# Patient Record
Sex: Female | Born: 1960 | Race: Black or African American | Hispanic: No | Marital: Married | Smoking: Never smoker
Health system: Southern US, Community
[De-identification: ages and names within clinical notes are randomized; demographics above are authoritative.]

## PROBLEM LIST (undated history)

## (undated) DIAGNOSIS — M545 Low back pain, unspecified: Secondary | ICD-10-CM

## (undated) DIAGNOSIS — R143 Flatulence: Secondary | ICD-10-CM

## (undated) DIAGNOSIS — E669 Obesity, unspecified: Secondary | ICD-10-CM

## (undated) DIAGNOSIS — B379 Candidiasis, unspecified: Secondary | ICD-10-CM

## (undated) DIAGNOSIS — R220 Localized swelling, mass and lump, head: Secondary | ICD-10-CM

## (undated) DIAGNOSIS — R7309 Other abnormal glucose: Secondary | ICD-10-CM

## (undated) DIAGNOSIS — I10 Essential (primary) hypertension: Secondary | ICD-10-CM

## (undated) DIAGNOSIS — B351 Tinea unguium: Secondary | ICD-10-CM

## (undated) DIAGNOSIS — M542 Cervicalgia: Secondary | ICD-10-CM

## (undated) HISTORY — DX: Cervicalgia: M54.2

## (undated) HISTORY — DX: Candidiasis, unspecified: B37.9

## (undated) HISTORY — DX: Low back pain: M54.5

## (undated) HISTORY — DX: Other abnormal glucose: R73.09

## (undated) HISTORY — DX: Low back pain, unspecified: M54.50

## (undated) HISTORY — DX: Morbid (severe) obesity due to excess calories: E66.01

## (undated) HISTORY — PX: ABDOMINAL HYSTERECTOMY: SHX81

## (undated) HISTORY — DX: Essential (primary) hypertension: I10

## (undated) HISTORY — DX: Localized swelling, mass and lump, head: R22.0

## (undated) HISTORY — DX: Obesity, unspecified: E66.9

## (undated) HISTORY — DX: Tinea unguium: B35.1

## (undated) HISTORY — DX: Flatulence: R14.3

---

## 2013-01-18 ENCOUNTER — Ambulatory Visit: Payer: BC Managed Care – PPO | Attending: Family Medicine | Admitting: Family Medicine

## 2013-01-18 VITALS — BP 138/91 | HR 76 | Temp 97.9°F | Resp 18 | Wt 258.8 lb

## 2013-01-18 DIAGNOSIS — R002 Palpitations: Secondary | ICD-10-CM | POA: Insufficient documentation

## 2013-01-18 DIAGNOSIS — I10 Essential (primary) hypertension: Secondary | ICD-10-CM | POA: Insufficient documentation

## 2013-01-18 LAB — CBC WITH DIFFERENTIAL/PLATELET
Basophils Absolute: 0 10*3/uL (ref 0.0–0.1)
HCT: 37.5 % (ref 36.0–46.0)
Hemoglobin: 12.5 g/dL (ref 12.0–15.0)
Lymphocytes Relative: 31 % (ref 12–46)
Lymphs Abs: 2 10*3/uL (ref 0.7–4.0)
Monocytes Absolute: 0.4 10*3/uL (ref 0.1–1.0)
Monocytes Relative: 7 % (ref 3–12)
Neutro Abs: 3.9 10*3/uL (ref 1.7–7.7)
RBC: 4.38 MIL/uL (ref 3.87–5.11)
RDW: 15.6 % — ABNORMAL HIGH (ref 11.5–15.5)
WBC: 6.4 10*3/uL (ref 4.0–10.5)

## 2013-01-18 NOTE — Patient Instructions (Signed)
Palpitations  A palpitation is the feeling that your heartbeat is irregular or is faster than normal. It may feel like your heart is fluttering or skipping a beat. Palpitations are usually not a serious problem. However, in some cases, you may need further medical evaluation. CAUSES  Palpitations can be caused by:  Smoking.  Caffeine or other stimulants, such as diet pills or energy drinks.  Alcohol.  Stress and anxiety.  Strenuous physical activity.  Fatigue.  Certain medicines.  Heart disease, especially if you have a history of arrhythmias. This includes atrial fibrillation, atrial flutter, or supraventricular tachycardia.  An improperly working pacemaker or defibrillator. DIAGNOSIS  To find the cause of your palpitations, your caregiver will take your history and perform a physical exam. Tests may also be done, including:  Electrocardiography (ECG). This test records the heart's electrical activity.  Cardiac monitoring. This allows your caregiver to monitor your heart rate and rhythm in real time.  Holter monitor. This is a portable device that records your heartbeat and can help diagnose heart arrhythmias. It allows your caregiver to track your heart activity for several days, if needed.  Stress tests by exercise or by giving medicine that makes the heart beat faster. TREATMENT  Treatment of palpitations depends on the cause of your symptoms and can vary greatly. Most cases of palpitations do not require any treatment other than time, relaxation, and monitoring your symptoms. Other causes, such as atrial fibrillation, atrial flutter, or supraventricular tachycardia, usually require further treatment. HOME CARE INSTRUCTIONS   Avoid:  Caffeinated coffee, tea, soft drinks, diet pills, and energy drinks.  Chocolate.  Alcohol.  Stop smoking if you smoke.  Reduce your stress and anxiety. Things that can help you relax include:  A method that measures bodily functions so  you can learn to control them (biofeedback).  Yoga.  Meditation.  Physical activity such as swimming, jogging, or walking.  Get plenty of rest and sleep. SEEK MEDICAL CARE IF:   You continue to have a fast or irregular heartbeat beyond 24 hours.  Your palpitations occur more often. SEEK IMMEDIATE MEDICAL CARE IF:  You develop chest pain or shortness of breath.  You have a severe headache.  You feel dizzy, or you faint. MAKE SURE YOU:  Understand these instructions.  Will watch your condition.  Will get help right away if you are not doing well or get worse. Document Released: 08/02/2000 Document Revised: 02/04/2012 Document Reviewed: 10/04/2011 Exit[employer] Patient Information 2014 [employer].  

## 2013-01-18 NOTE — Progress Notes (Signed)
Patient here to establish care History of HTN 

## 2013-01-18 NOTE — Progress Notes (Signed)
Subjective:     Patient ID: [name] [name], female   DOB: 11/27/1960, 52 y.o.   MRN: [id number]  HPI Pt here to establish care. Has h/o htn and experiences palpitations several times per week. Recently moved here from [place].   HTN - on hctz only, not sure when she most recently had labs drawn. No chesp pain, shob, or lghtheadedness.   Palpitations - have been going on for several months, occur 2-3 times per week, various lengths of time they last for, she is unsure what brings them on or what alleviates them. No pain, lightheadedness associated.   Nonsmoker, nondrinker.   Review of Systems per hpi     Objective:   Physical Exam  Nursing note and vitals reviewed. Constitutional: She appears well-developed and well-nourished.  Cardiovascular: Normal rate, regular rhythm and normal heart sounds.   Pulmonary/Chest: Effort normal and breath sounds normal.  Abdominal: Soft. Bowel sounds are normal.  Skin: Skin is warm and dry.  Psychiatric: She has a normal mood and affect.       Assessment:     Essential hypertension, benign - Plan: CBC with Differential, Hemoglobin A1c, Urinalysis Dipstick, Microalbumin/Creatinine Ratio, Urine, TSH, Vitamin D, 25-hydroxy, Comprehensive metabolic panel, Holter monitor - 48 hour, Obtain medical records  Palpitations - Plan: CBC with Differential, TSH, Comprehensive metabolic panel, EKG 12-Lead, Holter monitor - 48 hour, Obtain medical records       Plan:     htn - cont meds, will have her rtc 1 week for bp check as it is above goal at present. If above goal next week will change to zetoretic. Checking basic labs today.   Palpitations - ecg today reassuring (nsr), 48 hours holder monitor, likely refer to cardiology. Ed if acutely worse.   Request records from former pcp. rtc 1 week for bp check and 2 weeks for f/u on htn, palpitations, review labs. Once we get records we can set her up for a wellness exam as well so we can get on track with  cancer screenings etc.

## 2013-01-19 ENCOUNTER — Encounter: Payer: Self-pay | Admitting: Family Medicine

## 2013-01-19 LAB — MICROALBUMIN / CREATININE URINE RATIO
Creatinine, Urine: 279.1 mg/dL
Microalb, Ur: 0.9 mg/dL (ref 0.00–1.89)

## 2013-01-19 LAB — TSH: TSH: 0.895 u[IU]/mL (ref 0.350–4.500)

## 2013-01-19 LAB — COMPREHENSIVE METABOLIC PANEL
AST: 12 U/L (ref 0–37)
BUN: 14 mg/dL (ref 6–23)
Calcium: 8.7 mg/dL (ref 8.4–10.5)
Chloride: 102 mEq/L (ref 96–112)
Creat: 0.83 mg/dL (ref 0.50–1.10)

## 2013-01-19 LAB — HEMOGLOBIN A1C: Mean Plasma Glucose: 108 mg/dL (ref <117)

## 2013-01-25 ENCOUNTER — Ambulatory Visit (HOSPITAL_COMMUNITY)
Admission: RE | Admit: 2013-01-25 | Discharge: 2013-01-25 | Disposition: A | Discharge status: Home / Self Care | Payer: BC Managed Care – PPO | Source: Ambulatory Visit | Attending: Internal Medicine | Admitting: Internal Medicine

## 2013-01-25 ENCOUNTER — Ambulatory Visit: Payer: BC Managed Care – PPO | Attending: Family Medicine | Admitting: Internal Medicine

## 2013-01-25 VITALS — BP 149/101 | HR 77 | Temp 98.0°F | Resp 15

## 2013-01-25 DIAGNOSIS — M25549 Pain in joints of unspecified hand: Secondary | ICD-10-CM | POA: Insufficient documentation

## 2013-01-25 DIAGNOSIS — M79641 Pain in right hand: Secondary | ICD-10-CM

## 2013-01-25 DIAGNOSIS — M79609 Pain in unspecified limb: Secondary | ICD-10-CM | POA: Insufficient documentation

## 2013-01-25 DIAGNOSIS — M6530 Trigger finger, unspecified finger: Secondary | ICD-10-CM | POA: Insufficient documentation

## 2013-01-25 MED ORDER — LISINOPRIL-HYDROCHLOROTHIAZIDE 20-25 MG PO TABS: 1.0000 | ORAL_TABLET | Freq: Every day | ORAL | Status: DC

## 2013-01-25 NOTE — Progress Notes (Signed)
Patient ID: [name] [name], female   DOB: May 06, 1961, 52 y.o.   MRN: [id number]  CC: Followup  HPI: Patient is 52 year old female who comes to clinic for followup appointment. She has had blood test done last week and wants to know the results. She would also like to get a referral to bariatric surgery. She is explaining that she has not been able to lose any weight despite of dieting and exercising. She denies chest pain or shortness of breath, no significant weight gain, no abdominal or urinary concerns, no other systemic symptoms. She also was to have a blood pressure checked and get a refill on the medication.  No Known Allergies Past Medical History  Diagnosis Date  . Hypertension    No current outpatient prescriptions on file prior to visit.   No current facility-administered medications on file prior to visit.   No family history on file. History   Social History  . Marital Status: Married    Spouse Name: N/A    Number of Children: N/A  . Years of Education: N/A   Occupational History  . Not on file.   Social History Main Topics  . Smoking status: Not on file  . Smokeless tobacco: Not on file  . Alcohol Use: Not on file  . Drug Use: Not on file  . Sexually Active: Not on file   Other Topics Concern  . Not on file   Social History Narrative  . No narrative on file    Review of Systems  Constitutional: Negative for fever, chills, diaphoresis, activity change, appetite change and fatigue.  HENT: Negative for ear pain, nosebleeds, congestion, facial swelling, rhinorrhea, neck pain, neck stiffness and ear discharge.   Eyes: Negative for pain, discharge, redness, itching and visual disturbance.  Respiratory: Negative for cough, choking, chest tightness, shortness of breath, wheezing and stridor.   Cardiovascular: Negative for chest pain, palpitations and leg swelling.  Gastrointestinal: Negative for abdominal distention.  Genitourinary: Negative for dysuria, urgency,  frequency, hematuria, flank pain, decreased urine volume, difficulty urinating and dyspareunia.  Musculoskeletal: Negative for back pain, joint swelling, arthralgias and gait problem.  Neurological: Negative for dizziness, tremors, seizures, syncope, facial asymmetry, speech difficulty, weakness, light-headedness, numbness and headaches.  Hematological: Negative for adenopathy. Does not bruise/bleed easily.  Psychiatric/Behavioral: Negative for hallucinations, behavioral problems, confusion, dysphoric mood, decreased concentration and agitation.    Objective:   Filed Vitals:   01/25/13 1145  BP: 149/101  Pulse: 77  Temp: 98 F (36.7 C)  Resp: 15    Physical Exam  Constitutional: Appears well-developed and well-nourished. No distress.  HENT: Normocephalic. External right and left ear normal. Oropharynx is clear and moist.  Eyes: Conjunctivae and EOM are normal. PERRLA, no scleral icterus.  Neck: Normal ROM. Neck supple. No JVD. No tracheal deviation. No thyromegaly.  CVS: RRR, S1/S2 +, no murmurs, no gallops, no carotid bruit.  Pulmonary: Effort and breath sounds normal, no stridor, rhonchi, wheezes, rales.  Abdominal: Soft. BS +,  no distension, tenderness, rebound or guarding.  Musculoskeletal: Normal range of motion. No edema and no tenderness.  Lymphadenopathy: No lymphadenopathy noted, cervical, inguinal. Neuro: Alert. Normal reflexes, muscle tone coordination. No cranial nerve deficit. Skin: Skin is warm and dry. No rash noted. Not diaphoretic. No erythema. No pallor.  Psychiatric: Normal mood and affect. Behavior, judgment, thought content normal.   Lab Results  Component Value Date   WBC 6.4 01/18/2013   HGB 12.5 01/18/2013   HCT 37.5 01/18/2013   MCV 85.6  01/18/2013   PLT 202 01/18/2013   Lab Results  Component Value Date   CREATININE 0.83 01/18/2013   BUN 14 01/18/2013   NA 140 01/18/2013   K 4.1 01/18/2013   CL 102 01/18/2013   CO2 29 01/18/2013    Lab Results  Component Value  Date   HGBA1C 5.4 01/18/2013   Lipid Panel  No results found for this basename: chol, trig, hdl, cholhdl, vldl, ldlcalc       Assessment and plan:   Hypertension - slightly above the target range. I will start patient on lisinopril-HCTZ, will prescribe 5 refills, I have advised patient to check her blood pressure regularly and to call us back if the numbers are persistently higher than 140/90. She may need additional antihypertensive medication to improve blood pressure control. We have also discussed watching diet and writing things down, food diary recommended and discussed in detail.

## 2013-01-25 NOTE — Patient Instructions (Addendum)

## 2013-01-25 NOTE — Progress Notes (Signed)
Patient here for follow up for her B/P

## 2013-02-01 ENCOUNTER — Ambulatory Visit: Payer: BC Managed Care – PPO

## 2013-03-01 ENCOUNTER — Telehealth: Payer: Self-pay | Admitting: Family Medicine

## 2013-03-01 NOTE — Telephone Encounter (Signed)
Pt needs her lab results. ~CC

## 2013-03-01 NOTE — Telephone Encounter (Signed)
Pt would like her lab results. ~CC

## 2013-03-02 NOTE — Telephone Encounter (Signed)
Patient would like lab results please review and advise

## 2013-03-03 ENCOUNTER — Telehealth: Payer: Self-pay | Admitting: *Deleted

## 2013-03-03 NOTE — Telephone Encounter (Signed)
03/03/13 Patient made aware of lab results. Would like prescription called into  [facility] on [address]. P.Larysa Pall,RN BSN MHA

## 2013-03-03 NOTE — Telephone Encounter (Signed)
03/03/13 Prescription called into [facility] on [address] per pt. Request. Levothyroxine 100 mcg po daily #30 refill x 2 per Dr. Laural Benes. P.Stan Cantave,RN BSN MHA

## 2013-12-13 ENCOUNTER — Other Ambulatory Visit: Payer: Self-pay

## 2013-12-13 DIAGNOSIS — Z1231 Encounter for screening mammogram for malignant neoplasm of breast: Secondary | ICD-10-CM

## 2013-12-24 ENCOUNTER — Ambulatory Visit: Payer: BC Managed Care – PPO

## 2013-12-28 ENCOUNTER — Ambulatory Visit
Admission: RE | Admit: 2013-12-28 | Discharge: 2013-12-28 | Disposition: A | Discharge status: Home / Self Care | Payer: BC Managed Care – PPO | Source: Ambulatory Visit

## 2013-12-28 ENCOUNTER — Encounter (INDEPENDENT_AMBULATORY_CARE_PROVIDER_SITE_OTHER): Payer: Self-pay

## 2013-12-28 DIAGNOSIS — Z1231 Encounter for screening mammogram for malignant neoplasm of breast: Secondary | ICD-10-CM

## 2014-07-07 ENCOUNTER — Other Ambulatory Visit: Payer: Self-pay | Admitting: Internal Medicine

## 2016-10-30 DIAGNOSIS — Z0000 Encounter for general adult medical examination without abnormal findings: Secondary | ICD-10-CM | POA: Diagnosis not present

## 2016-10-30 DIAGNOSIS — I10 Essential (primary) hypertension: Secondary | ICD-10-CM | POA: Diagnosis not present

## 2016-11-06 ENCOUNTER — Other Ambulatory Visit: Payer: Self-pay | Admitting: Internal Medicine

## 2016-11-06 DIAGNOSIS — Z1231 Encounter for screening mammogram for malignant neoplasm of breast: Secondary | ICD-10-CM

## 2016-11-26 ENCOUNTER — Ambulatory Visit
Admission: RE | Admit: 2016-11-26 | Discharge: 2016-11-26 | Disposition: A | Discharge status: Home / Self Care | Payer: BLUE CROSS/BLUE SHIELD | Source: Ambulatory Visit | Attending: Internal Medicine | Admitting: Internal Medicine

## 2016-11-26 DIAGNOSIS — Z1231 Encounter for screening mammogram for malignant neoplasm of breast: Secondary | ICD-10-CM

## 2016-11-28 ENCOUNTER — Other Ambulatory Visit: Payer: Self-pay | Admitting: Internal Medicine

## 2016-11-28 DIAGNOSIS — R928 Other abnormal and inconclusive findings on diagnostic imaging of breast: Secondary | ICD-10-CM

## 2016-12-02 ENCOUNTER — Ambulatory Visit
Admission: RE | Admit: 2016-12-02 | Discharge: 2016-12-02 | Disposition: A | Discharge status: Home / Self Care | Payer: BLUE CROSS/BLUE SHIELD | Source: Ambulatory Visit | Attending: Internal Medicine | Admitting: Internal Medicine

## 2016-12-02 DIAGNOSIS — R928 Other abnormal and inconclusive findings on diagnostic imaging of breast: Secondary | ICD-10-CM

## 2016-12-02 DIAGNOSIS — N6489 Other specified disorders of breast: Secondary | ICD-10-CM | POA: Diagnosis not present

## 2016-12-03 ENCOUNTER — Telehealth: Payer: Self-pay | Admitting: Cardiovascular Disease

## 2016-12-03 NOTE — Telephone Encounter (Signed)
Received records from [facility] - Dr [name] for appointment on 12/05/16 with Dr Oval Linsey.  Records put with Dr Blenda Mounts schedule for 12/05/16. lp

## 2016-12-05 ENCOUNTER — Encounter: Payer: Self-pay | Admitting: Cardiovascular Disease

## 2016-12-05 ENCOUNTER — Ambulatory Visit (INDEPENDENT_AMBULATORY_CARE_PROVIDER_SITE_OTHER): Payer: BLUE CROSS/BLUE SHIELD | Admitting: Cardiovascular Disease

## 2016-12-05 VITALS — BP 132/84 | HR 74 | Ht 66.0 in | Wt 256.0 lb

## 2016-12-05 DIAGNOSIS — I10 Essential (primary) hypertension: Secondary | ICD-10-CM | POA: Diagnosis not present

## 2016-12-05 NOTE — Progress Notes (Signed)
Cardiology Office Note   Date:  12/07/2016   ID:  Arianah Torgeson, DOB 1960-12-08, MRN [id number]  PCP:  Maximino Greenland, MD  Cardiologist:   Skeet Latch, MD   Chief Complaint  Patient presents with  . Follow-up    New patient.  . Shortness of Breath      History of Present Illness: Cortnee Steinmiller is a 56 y.o. female with hypertension and obesity who presents for evaluation of an abnormal EKG.  Ms. Gintz saw Dr. [name] and was noted to have an incomplete RBBB on EKG.  She also reported shortness of breath and was referred to cardiology for further evaluation.  Overall she has been feeling well.   She notes that hse has been under a lot of stress lately because she is in school studying medical administration.  She isn't sleeping or eating well.    She was feeling very tired but this improved with taking vitamin D and multivitamins.  She is currently in school and has been stressed.  She also isn't sleeping well or eating well.  She denies chest pain but gets short of breath when walking up stairs or exerting herself. She only gets palpitations before taking a test.  She doesn't drink much caffeine.  She has not been experiencing any lower extremity edema, orthopnea or PND.  Ms. Reddick father had a heart attack in his 3s.  She has been trying to eat healthier by limiting the salt in her diet and changing the way she cooks.    Past Medical History:  Diagnosis Date  . Essential hypertension 12/07/2016  . Hypertension   . Morbid obesity ([place]) 12/07/2016  . Obesity 12/07/2016    Past Surgical History:  Procedure Laterality Date  . ABDOMINAL HYSTERECTOMY       Current Outpatient Prescriptions  Medication Sig Dispense Refill  . lisinopril-hydrochlorothiazide (PRINZIDE,ZESTORETIC) 20-25 MG per tablet Take 1 tablet by mouth daily. 90 tablet 3   No current facility-administered medications for this visit.     Allergies:   Patient has no known allergies.     Social History:  The patient  reports that she has never smoked. She has never used smokeless tobacco.   Family History:  The patient's family history includes Diabetes in her father; Heart attack in her father; Heart failure in her father; Hypertension in her mother.    ROS:  Please see the history of present illness.   Otherwise, review of systems are positive for none.   All other systems are reviewed and negative.    PHYSICAL EXAM: VS:  BP 132/84   Pulse 74   Ht 5\' 6"  (1.676 m)   Wt 116.1 kg (256 lb)   BMI 41.32 kg/m  , BMI Body mass index is 41.32 kg/m. GENERAL:  Well appearing HEENT:  Pupils equal round and reactive, fundi not visualized, oral mucosa unremarkable NECK:  No jugular venous distention, waveform within normal limits, carotid upstroke brisk and symmetric, no bruits, no thyromegaly LYMPHATICS:  No cervical adenopathy LUNGS:  Clear to auscultation bilaterally HEART:  RRR.  PMI not displaced or sustained,S1 and S2 within normal limits, no S3, no S4, no clicks, no rubs, no murmurs ABD:  Flat, positive bowel sounds normal in frequency in pitch, no bruits, no rebound, no guarding, no midline pulsatile mass, no hepatomegaly, no splenomegaly EXT:  2 plus pulses throughout, no edema, no cyanosis no clubbing SKIN:  No rashes no nodules NEURO:  Cranial nerves II through XII  grossly intact, motor grossly intact throughout PSYCH:  Cognitively intact, oriented to person place and time    EKG:  EKG is ordered today. The ekg ordered today demonstrates sinus rhythm.  Rate 74 bpm.  Left axis deviation 10/27/16: Sinus rhythm. Rate 64 bpm.   Recent Labs: No results found for requested labs within last 8760 hours.   10/30/16: Total cholesterol 194, triglycerides 77, HDL 60, LDL 120 Integument: A1c 5.4% Sodium 140, potassium 3.9, BUN 16, creatinine 0.95 WBC 5.2, hemoglobin 12.2, hematocrit 37.3, platelets 226  Lipid Panel No results found for: CHOL, TRIG, HDL, CHOLHDL,  VLDL, LDLCALC, LDLDIRECT    Wt Readings from Last 3 Encounters:  12/05/16 116.1 kg (256 lb)  01/18/13 117.4 kg (258 lb 12.8 oz)      ASSESSMENT AND PLAN:  # Abnormal EKG:  Ms. Royer previous EKG showed RSr' in V1.  This is not pathologic.  She has no symptoms of ischemia other than exertional shortness of breath, which is likely due to obesity and deconditioning.  Given her family history of CAD we discussed the possibility of getting a coronary calcium score.  Her ASCVD 10 year risk is 5%, which would mean she doesn't need a statin or aspirin.  However, if her calcium score is high we would reconsider.  She will think about this and let us knwo in the future.  # Hypertension: BP slightly above goal (130/80) both initially and on repeat.  We discussed the importance of limiting salt and increasing exercise to at least 30-40 minutes most days of the week.  We will reassess in 3 months.  Continue HCTZ-lisinopril.   Current medicines are reviewed at length with the patient today.  The patient does not have concerns regarding medicines.  The following changes have been made:  no change  Labs/ tests ordered today include:   Orders Placed This Encounter  Procedures  . EKG 12-Lead     Disposition:   FU with Omarrion Carmer C. Oval Linsey, MD, [facility] in 3 months    This note was written with the assistance of speech recognition software.  Please excuse any transcriptional errors.  Signed, Rhys Lichty C. Oval Linsey, MD, [facility]  12/07/2016 11:47 AM    [place]

## 2016-12-05 NOTE — Patient Instructions (Signed)
Medication Instructions:  Your physician recommends that you continue on your current medications as directed. Please refer to the Current Medication list given to you today.  Labwork: none  Testing/Procedures: none  Follow-Up: Your physician recommends that you schedule a follow-up appointment in: 3 month ov  Any Other Special Instructions Will Be Listed Below (If Applicable). TRY TO EXERCISE 30-40 MINUTES MOST DAYS OF THE WEEK   If you need a refill on your cardiac medications before your next appointment, please call your pharmacy.

## 2016-12-07 ENCOUNTER — Encounter: Payer: Self-pay | Admitting: Cardiovascular Disease

## 2016-12-07 DIAGNOSIS — I10 Essential (primary) hypertension: Secondary | ICD-10-CM

## 2016-12-07 DIAGNOSIS — E669 Obesity, unspecified: Secondary | ICD-10-CM

## 2016-12-07 HISTORY — DX: Obesity, unspecified: E66.9

## 2016-12-07 HISTORY — DX: Morbid (severe) obesity due to excess calories: E66.01

## 2016-12-07 HISTORY — DX: Essential (primary) hypertension: I10

## 2016-12-20 DIAGNOSIS — R799 Abnormal finding of blood chemistry, unspecified: Secondary | ICD-10-CM | POA: Diagnosis not present

## 2016-12-31 DIAGNOSIS — R799 Abnormal finding of blood chemistry, unspecified: Secondary | ICD-10-CM | POA: Diagnosis not present

## 2017-01-14 DIAGNOSIS — R799 Abnormal finding of blood chemistry, unspecified: Secondary | ICD-10-CM | POA: Diagnosis not present

## 2017-02-13 ENCOUNTER — Ambulatory Visit (HOSPITAL_BASED_OUTPATIENT_CLINIC_OR_DEPARTMENT_OTHER): Payer: BLUE CROSS/BLUE SHIELD | Admitting: Oncology

## 2017-02-13 VITALS — BP 150/93 | HR 86 | Temp 99.0°F | Resp 18 | Ht 66.0 in | Wt 255.1 lb

## 2017-02-13 DIAGNOSIS — D472 Monoclonal gammopathy: Secondary | ICD-10-CM | POA: Diagnosis not present

## 2017-02-13 DIAGNOSIS — I10 Essential (primary) hypertension: Secondary | ICD-10-CM

## 2017-02-13 NOTE — Progress Notes (Signed)
Reason for Referral: Monoclonal gammopathy.   HPI: 56 year old woman currently of [place] where she has been living for the last 6 years. He is a pleasant woman with history of hypertension and mild obesity. She has been donating a plasma last few months and was noted to have an elevated gamma globulins on her routine laboratory testing. She underwent a repeat workup with her primary care physician done including a serum protein electrophoresis which detected an M spike of around 0.7 g/dL. Her urine electrophoresis was normal. She had a normal CBC, chemistries and she is completely asymptomatic. She denied any pathological fractures, bone pain or recurrent infections. She continues to be active and works full time. She denied any excessive fatigue or tiredness.  She is not reporting any headaches, blurry vision, syncope or seizures. She does not report any fevers, chills or sweats. She is not report any cough, wheezing or hemoptysis. She does not report any nausea, vomiting or abdominal pain. She does not report any frequency urgency or hesitancy. She does not report any skeletal complaints. She does not report any arthralgias or myalgias. Remaining review of systems unremarkable.   Past Medical History:  Diagnosis Date  . Essential hypertension 12/07/2016  . Hypertension   . Morbid obesity ([place]) 12/07/2016  . Obesity 12/07/2016  :  Past Surgical History:  Procedure Laterality Date  . ABDOMINAL HYSTERECTOMY    :   Current Outpatient Prescriptions:  .  Cholecalciferol (VITAMIN D3) 3000 units TABS, Take 5,000 Units by mouth daily., Disp: , Rfl:  .  GLUCOSA-CHONDR-NA CHONDR-MSM PO, Take 1 capsule by mouth daily. 1500/1103 mg, Disp: , Rfl:  .  lisinopril-hydrochlorothiazide (PRINZIDE,ZESTORETIC) 20-25 MG per tablet, Take 1 tablet by mouth daily., Disp: 90 tablet, Rfl: 3 .  Multiple Vitamins-Minerals (CENTRUM SILVER PO), Take 1 tablet by mouth daily., Disp: , Rfl: :  No Known  Allergies:  Family History  Problem Relation Age of Onset  . Hypertension Mother   . Heart failure Father   . Diabetes Father   . Heart attack Father   . Breast cancer Neg Hx   :  Social History   Social History  . Marital status: Married    Spouse name: N/A  . Number of children: N/A  . Years of education: N/A   Occupational History  . Not on file.   Social History Main Topics  . Smoking status: Never Smoker  . Smokeless tobacco: Never Used  . Alcohol use Not on file  . Drug use: Unknown  . Sexual activity: Not on file   Other Topics Concern  . Not on file   Social History Narrative  . No narrative on file  :  Pertinent items are noted in HPI.  Exam: Blood pressure (!) 150/93, pulse 86, temperature 99 F (37.2 C), temperature source Oral, resp. rate 18, height _0  (1.676 m), weight 255 lb 1.6 oz (115.7 kg), SpO2 99 %. General appearance: alert and cooperative Throat: lips, mucosa, and tongue normal; teeth and gums normal Neck: no adenopathy Back: negative Resp: clear to auscultation bilaterally Chest wall: no tenderness Cardio: regular rate and rhythm, S1, S2 normal, no murmur, click, rub or gallop GI: soft, non-tender; bowel sounds normal; no masses,  no organomegaly Extremities: extremities normal, atraumatic, no cyanosis or edema Pulses: 2+ and symmetric  CBC    Component Value Date/Time   WBC 6.4 01/18/2013 1253   RBC 4.38 01/18/2013 1253   HGB 12.5 01/18/2013 1253   HCT 37.5 01/18/2013 1253  PLT 202 01/18/2013 1253   MCV 85.6 01/18/2013 1253   MCH 28.5 01/18/2013 1253   MCHC 33.3 01/18/2013 1253   RDW 15.6 (H) 01/18/2013 1253   LYMPHSABS 2.0 01/18/2013 1253   MONOABS 0.4 01/18/2013 1253   EOSABS 0.1 01/18/2013 1253   BASOSABS 0.0 01/18/2013 1253     Chemistry      Component Value Date/Time   NA 140 01/18/2013 1253   K 4.1 01/18/2013 1253   CL 102 01/18/2013 1253   CO2 29 01/18/2013 1253   BUN 14 01/18/2013 1253   CREATININE 0.83  01/18/2013 1253      Component Value Date/Time   CALCIUM 8.7 01/18/2013 1253   ALKPHOS 37 (L) 01/18/2013 1253   AST 12 01/18/2013 1253   ALT <8 01/18/2013 1253   BILITOT 0.6 01/18/2013 1253       Assessment and Plan:   56 year old woman with the following issues:  1. Monoclonal gammopathy detected incidentally with an M spike of 0.7 g/dL. This is in the setting of a normal CBC and chemistries. The differential diagnosis of this findings were reviewed with the patient today. These findings could be related to reactive process versus a plasma cell disorder. Plasma cell disorder such as a monoclonal gammopathy of undetermined significance as a possibility. Multiple myeloma, amyloidosis among others are considered extremely unlikely. She is completely asymptomatic without any evidence of end organ damage. She has normal kidney function and electrolytes.  From a management standpoint, I have recommended observation and surveillance for the time being. I will obtain a repeat serum protein electrophoresis, quantitative immunoglobulins as well as immunofixation in 6 months. Depending on these findings, we will consider a skeletal survey or a bone marrow biopsy if the liver is findings to suggest multiple myeloma.  The presentation suggest likely reactive process and less likely a plasma cell disorder.  2. Follow-up: Will be in 6 months to repeat her protein studies.

## 2017-02-16 ENCOUNTER — Telehealth: Payer: Self-pay | Admitting: Oncology

## 2017-02-16 NOTE — Telephone Encounter (Signed)
Lvm advising appts 08/22/17 + 08/19/17. Also, mailed calendar.

## 2017-03-05 ENCOUNTER — Ambulatory Visit: Payer: BLUE CROSS/BLUE SHIELD | Admitting: Cardiovascular Disease

## 2017-03-05 ENCOUNTER — Telehealth: Payer: Self-pay | Admitting: Oncology

## 2017-03-05 NOTE — Telephone Encounter (Signed)
Faxed records to [facility] [phone]

## 2017-04-23 ENCOUNTER — Encounter: Payer: Self-pay | Admitting: Cardiovascular Disease

## 2017-04-23 ENCOUNTER — Ambulatory Visit (INDEPENDENT_AMBULATORY_CARE_PROVIDER_SITE_OTHER): Payer: BLUE CROSS/BLUE SHIELD | Admitting: Cardiovascular Disease

## 2017-04-23 VITALS — BP 151/94 | HR 70 | Ht 67.0 in | Wt 252.2 lb

## 2017-04-23 DIAGNOSIS — R0602 Shortness of breath: Secondary | ICD-10-CM

## 2017-04-23 DIAGNOSIS — I10 Essential (primary) hypertension: Secondary | ICD-10-CM | POA: Diagnosis not present

## 2017-04-23 MED ORDER — CARVEDILOL 12.5 MG PO TABS: 12.5000 mg | ORAL_TABLET | Freq: Two times a day (BID) | ORAL | 11 refills | Status: DC

## 2017-04-23 NOTE — Patient Instructions (Signed)
Medication Instructions:  START CARVEDILOL 12.5 MG TWICE A DAY   Labwork: NONE  Testing/Procedures: NONE  Follow-Up: Your physician wants you to follow-up in: [place] will receive a reminder letter in the mail two months in advance. If you don't receive a letter, please call our office to schedule the follow-up appointment.  Any Other Special Instructions Will Be Listed Below (If Applicable). MONITOR YOUR BLOOD PRESSURE ANE HEART RATE AT HOME IF IT DOES NOT REMAIN BELOW 130/80 CALL THE OFFICE AT [phone]  If you need a refill on your cardiac medications before your next appointment, please call your pharmacy.

## 2017-04-23 NOTE — Progress Notes (Signed)
Cardiology Office Note   Date:  04/23/2017   ID:  [name] [name], DOB 1960-09-07, MRN [id number]  PCP:  [name], MD  Cardiologist:   Skeet Latch, MD   Chief Complaint  Patient presents with  . Follow-up    3 months;      History of Present Illness: [name] [name] is a 56 y.o. female with hypertension and obesity who presents for follow up. She was initially seen 11/2016 for the evaluation of an abnormal EKG.  Ms. Sloss saw Dr. [name] and was noted to have an incomplete RBBB on EKG.  She also reported shortness of breath and was referred to cardiology for further evaluation.  Overall she was feeling well and didn't require an ischemia evaluation.  At that appointment her BP was noted to be elevated.  She wanted to work on diet and exercise before adding medication.  She has started exercising 2-3 times per week and notes that she has a little more energy.  She sometimes gets short of breath when walking up the stairs.  She notes occasional palpitations when she is anxious.  There is no associated chest pain, lightheadedness, shortness of breath or dizziness.   She has not been experiencing any lower extremity edema, orthopnea or PND.  She hasn't been checking her BP at home but takes all medications as prescribed.     Past Medical History:  Diagnosis Date  . Essential hypertension 12/07/2016  . Hypertension   . Morbid obesity ([place]) 12/07/2016  . Obesity 12/07/2016    Past Surgical History:  Procedure Laterality Date  . ABDOMINAL HYSTERECTOMY       Current Outpatient Prescriptions  Medication Sig Dispense Refill  . Cholecalciferol (VITAMIN D3) 3000 units TABS Take 5,000 Units by mouth daily.    Marland Kitchen GLUCOSA-CHONDR-NA CHONDR-MSM PO Take 1 capsule by mouth daily. 1500/1103 mg    . lisinopril-hydrochlorothiazide (PRINZIDE,ZESTORETIC) 20-25 MG per tablet Take 1 tablet by mouth daily. 90 tablet 3  . Multiple Vitamins-Minerals (CENTRUM SILVER PO) Take 1 tablet by  mouth daily.    . carvedilol (COREG) 12.5 MG tablet Take 1 tablet (12.5 mg total) by mouth 2 (two) times daily. 60 tablet 11   No current facility-administered medications for this visit.     Allergies:   Patient has no known allergies.    Social History:  The patient  reports that she has never smoked. She has never used smokeless tobacco.   Family History:  The patient's family history includes Diabetes in her father; Heart attack in her father; Heart failure in her father; Hypertension in her mother.    ROS:  Please see the history of present illness.   Otherwise, review of systems are positive for hot flashes.   All other systems are reviewed and negative.    PHYSICAL EXAM: VS:  BP (!) 151/94   Pulse 70   Ht 5\' 7"  (1.702 m)   Wt 114.4 kg (252 lb 3.2 oz)   BMI 39.50 kg/m  , BMI Body mass index is 39.5 kg/m. GENERAL:  Well appearing.  No acute distress.   HEENT: Pupils equal round and reactive, fundi not visualized, oral mucosa unremarkable NECK:  No jugular venous distention, waveform within normal limits, carotid upstroke brisk and symmetric, no bruits, no thyromegaly LUNGS:  Clear to auscultation bilaterally.  No crackles, wheezes or rhonchi HEART:  Mostly regular with frequent ectopy.  PMI not displaced or sustained,S1 and S2 within normal limits, no S3, no S4, no clicks,  no rubs, no murmurs ABD:  Flat, positive bowel sounds normal in frequency in pitch, no bruits, no rebound, no guarding, no midline pulsatile mass, no hepatomegaly, no splenomegaly EXT:  2 plus pulses throughout, no edema, no cyanosis no clubbing SKIN:  No rashes no nodules NEURO:  Cranial nerves II through XII grossly intact, motor grossly intact throughout PSYCH:  Cognitively intact, oriented to person place and time   EKG:  EKG is ordered today. The ekg ordered today demonstrates sinus rhythm.  Rate 74 bpm.  Left axis deviation 10/27/16: Sinus rhythm. Rate 64 bpm.   Recent Labs: No results found for  requested labs within last 8760 hours.   10/30/16: Total cholesterol 194, triglycerides 77, HDL 60, LDL 120 Integument: A1c 5.4% Sodium 140, potassium 3.9, BUN 16, creatinine 0.95 WBC 5.2, hemoglobin 12.2, hematocrit 37.3, platelets 226  Lipid Panel No results found for: CHOL, TRIG, HDL, CHOLHDL, VLDL, LDLCALC, LDLDIRECT    Wt Readings from Last 3 Encounters:  04/23/17 114.4 kg (252 lb 3.2 oz)  02/13/17 115.7 kg (255 lb 1.6 oz)  12/05/16 116.1 kg (256 lb)      ASSESSMENT AND PLAN:  # Shortness of breath: Improving with exercise.    # Hypertension: BP remains above goal despite exercising.  We will add carvedilol 12.5mg  bid given that she also struggles with palpitations and anxiety.  Continue HCTZ-lisinopril.  Recommended 6-8 week follow up, but she prefers not to come back that soon due to cost concerns.     Current medicines are reviewed at length with the patient today.  The patient does not have concerns regarding medicines.  The following changes have been made:  Add carvedilol.  Labs/ tests ordered today include:   No orders of the defined types were placed in this encounter.    Disposition:   FU with Jaxston Chohan C. Oval Linsey, MD, [facility] in 6 months    This note was written with the assistance of speech recognition software.  Please excuse any transcriptional errors.  Signed, Brynden Thune C. Oval Linsey, MD, [facility]  04/23/2017 5:07 PM    [facility]

## 2017-05-09 DIAGNOSIS — I10 Essential (primary) hypertension: Secondary | ICD-10-CM | POA: Diagnosis not present

## 2017-05-28 ENCOUNTER — Other Ambulatory Visit: Payer: Self-pay | Admitting: Internal Medicine

## 2017-05-28 DIAGNOSIS — N6310 Unspecified lump in the right breast, unspecified quadrant: Secondary | ICD-10-CM

## 2017-06-05 ENCOUNTER — Other Ambulatory Visit: Payer: BLUE CROSS/BLUE SHIELD

## 2017-06-05 ENCOUNTER — Ambulatory Visit
Admission: RE | Admit: 2017-06-05 | Discharge: 2017-06-05 | Disposition: A | Discharge status: Home / Self Care | Payer: BLUE CROSS/BLUE SHIELD | Source: Ambulatory Visit | Attending: Internal Medicine | Admitting: Internal Medicine

## 2017-06-05 DIAGNOSIS — N6310 Unspecified lump in the right breast, unspecified quadrant: Secondary | ICD-10-CM | POA: Diagnosis not present

## 2017-08-22 ENCOUNTER — Other Ambulatory Visit (HOSPITAL_BASED_OUTPATIENT_CLINIC_OR_DEPARTMENT_OTHER): Payer: BLUE CROSS/BLUE SHIELD

## 2017-08-22 DIAGNOSIS — D472 Monoclonal gammopathy: Secondary | ICD-10-CM | POA: Diagnosis not present

## 2017-08-22 LAB — CBC WITH DIFFERENTIAL/PLATELET
BASO%: 0.6 % (ref 0.0–2.0)
BASOS ABS: 0 10*3/uL (ref 0.0–0.1)
EOS ABS: 0 10*3/uL (ref 0.0–0.5)
EOS%: 0.8 % (ref 0.0–7.0)
HEMATOCRIT: 37.7 % (ref 34.8–46.6)
HGB: 12.4 g/dL (ref 11.6–15.9)
LYMPH%: 30.1 % (ref 14.0–49.7)
MCH: 27.9 pg (ref 25.1–34.0)
MCHC: 32.8 g/dL (ref 31.5–36.0)
MCV: 85.2 fL (ref 79.5–101.0)
MONO#: 0.4 10*3/uL (ref 0.1–0.9)
MONO%: 7.3 % (ref 0.0–14.0)
NEUT#: 3.1 10*3/uL (ref 1.5–6.5)
NEUT%: 61.2 % (ref 38.4–76.8)
PLATELETS: 201 10*3/uL (ref 145–400)
RBC: 4.43 10*6/uL (ref 3.70–5.45)
RDW: 15.1 % — ABNORMAL HIGH (ref 11.2–14.5)
WBC: 5.1 10*3/uL (ref 3.9–10.3)
lymph#: 1.5 10*3/uL (ref 0.9–3.3)

## 2017-08-22 LAB — COMPREHENSIVE METABOLIC PANEL
ALBUMIN: 4 g/dL (ref 3.5–5.0)
ALT: 7 U/L (ref 0–55)
AST: 15 U/L (ref 5–34)
Alkaline Phosphatase: 52 U/L (ref 40–150)
Anion Gap: 7 mEq/L (ref 3–11)
BUN: 15.3 mg/dL (ref 7.0–26.0)
CHLORIDE: 101 meq/L (ref 98–109)
CO2: 31 meq/L — AB (ref 22–29)
Calcium: 9.3 mg/dL (ref 8.4–10.4)
Creatinine: 0.9 mg/dL (ref 0.6–1.1)
EGFR: >60 mL/min/{1.73_m2} (ref >60)
GLUCOSE: 83 mg/dL (ref 70–140)
POTASSIUM: 3.9 meq/L (ref 3.5–5.1)
SODIUM: 139 meq/L (ref 136–145)
Total Bilirubin: 0.51 mg/dL (ref 0.20–1.20)
Total Protein: 8.2 g/dL (ref 6.4–8.3)

## 2017-08-25 LAB — MULTIPLE MYELOMA PANEL, SERUM
ALBUMIN/GLOB SERPL: 1 (ref 0.7–1.7)
ALPHA 1: 0.2 g/dL (ref 0.0–0.4)
Albumin SerPl Elph-Mcnc: 3.6 g/dL (ref 2.9–4.4)
Alpha2 Glob SerPl Elph-Mcnc: 0.8 g/dL (ref 0.4–1.0)
B-Globulin SerPl Elph-Mcnc: 1 g/dL (ref 0.7–1.3)
Gamma Glob SerPl Elph-Mcnc: 2 g/dL — ABNORMAL HIGH (ref 0.4–1.8)
Globulin, Total: 4 g/dL — ABNORMAL HIGH (ref 2.2–3.9)
IGA/IMMUNOGLOBULIN A, SERUM: 92 mg/dL (ref 87–352)
IgG, Qn, Serum: 1931 mg/dL — ABNORMAL HIGH (ref 700–1600)
IgM, Qn, Serum: 94 mg/dL (ref 26–217)
M Protein SerPl Elph-Mcnc: 1 g/dL — ABNORMAL HIGH
Total Protein: 7.6 g/dL (ref 6.0–8.5)

## 2017-08-25 LAB — KAPPA/LAMBDA LIGHT CHAINS
IG KAPPA FREE LIGHT CHAIN: 11.6 mg/L (ref 3.3–19.4)
Ig Lambda Free Light Chain: 10.5 mg/L (ref 5.7–26.3)
KAPPA/LAMBDA FLC RATIO: 1.1 (ref 0.26–1.65)

## 2017-08-29 ENCOUNTER — Inpatient Hospital Stay: Payer: BLUE CROSS/BLUE SHIELD | Attending: Oncology | Admitting: Oncology

## 2017-08-29 ENCOUNTER — Telehealth: Payer: Self-pay | Admitting: Oncology

## 2017-08-29 VITALS — BP 155/92 | HR 74 | Temp 98.5°F | Resp 18 | Ht 67.0 in | Wt 242.5 lb

## 2017-08-29 DIAGNOSIS — D472 Monoclonal gammopathy: Secondary | ICD-10-CM | POA: Insufficient documentation

## 2017-08-29 NOTE — Progress Notes (Signed)
Hematology and Oncology Follow Up Visit  Tane Biegler [id number] Feb 14, 1961 57 y.o. 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  She is an M spike of 1 g/dL and IgG level of 1931.   Current therapy: Observation and surveillance.  Interim History: MS. Hoeger presents today for a follow-up visit.  Since the last visit she reports no recent complaints.  She denies any bone pain, pathological fractures or recurrent infections.  She denies any peripheral neuropathy.  She has been donating plasma on a regular basis but have not done so in the last 6 months.  She does report joint pain predominantly related to osteoarthritis.  She has no history of lupus or rheumatoid arthritis.  She is not reporting any headaches, blurry vision, syncope or seizures. She does not report any fevers, chills or sweats. She is not report any cough, wheezing or hemoptysis. She does not report any nausea, vomiting or abdominal pain. She does not report any frequency urgency or hesitancy. She does not report any skeletal complaints. She does not report any arthralgias or myalgias. Remaining review of systems unremarkable  Medications: I have reviewed the patient's current medications.  Current Outpatient Medications  Medication Sig Dispense Refill  . carvedilol (COREG) 12.5 MG tablet Take 1 tablet (12.5 mg total) by mouth 2 (two) times daily. 60 tablet 11  . Cholecalciferol (VITAMIN D3) 3000 units TABS Take 5,000 Units by mouth daily.    Marland Kitchen GLUCOSA-CHONDR-NA CHONDR-MSM PO Take 1 capsule by mouth daily. 1500/1103 mg    . lisinopril-hydrochlorothiazide (PRINZIDE,ZESTORETIC) 20-25 MG per tablet Take 1 tablet by mouth daily. 90 tablet 3  . Multiple Vitamins-Minerals (CENTRUM SILVER PO) Take 1 tablet by mouth daily.     No current facility-administered medications for this visit.      Allergies: No Known Allergies  Past Medical  History, Surgical history, Social history, and Family History were reviewed and updated.  Marland Kitchen Physical Exam: Blood pressure (!) 155/92, pulse 74, temperature 98.5 F (36.9 C), temperature source Oral, resp. rate 18, height _0  (1.702 m), weight 242 lb 8 oz (110 kg), SpO2 99 %. ECOG: 0 General appearance: alert and cooperative appeared without distress. Head: Normocephalic, without obvious abnormality  Oral mucosa is moist and pink without ulcers. Sclera anicteric.  Pupils are equal and round. Lymph nodes: Cervical, supraclavicular, and axillary nodes normal. Heart:regular rate and rhythm, S1, S2 normal, no murmur, click, rub or gallop Lung:chest clear, no wheezing, rales, normal symmetric air entry Abdomin: soft, non-tender, without masses or organomegaly Neurological: No deficits.   Lab Results: Lab Results  Component Value Date   WBC 5.1 08/22/2017   HGB 12.4 08/22/2017   HCT 37.7 08/22/2017   MCV 85.2 08/22/2017   PLT 201 08/22/2017     Chemistry      Component Value Date/Time   NA 139 08/22/2017 1254   K 3.9 08/22/2017 1254   CL 102 01/18/2013 1253   CO2 31 (H) 08/22/2017 1254   BUN 15.3 08/22/2017 1254   CREATININE 0.9 08/22/2017 1254      Component Value Date/Time   CALCIUM 9.3 08/22/2017 1254   ALKPHOS 52 08/22/2017 1254   AST 15 08/22/2017 1254   ALT 7 08/22/2017 1254   BILITOT 0.51 08/22/2017 1254     Results for ZABELLA, WEASE (MRN [id number]) as of 08/29/2017 12:55  Ref. Range 08/22/2017 12:54  IFE 1 Unknown Comment  Globulin, Total Latest Ref Range:  2.2 - 3.9 g/dL 4.0 (H)  B-Globulin SerPl Elph-Mcnc Latest Ref Range: 0.7 - 1.3 g/dL 1.0  IgG (Immunoglobin G), Serum Latest Ref Range: 700 - 1600 mg/dL 1,931 (H)  IgM, Qn, Serum Latest Ref Range: 26 - 217 mg/dL 94  M Protein SerPl Elph-Mcnc Latest Ref Range: Not Observed g/dL 1.0 (H)    Impression and Plan:  57 year old woman with the following issues:  1. 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  Detected incidentally with an M spike of 0.7 g/dL.   She has no evidence to suggest end organ damage with a normal CBC, electrolytes and kidney function.  The differential diagnosis was discussed today which include reactive monoclonal gammopathy, MGUS, multiple myeloma or amyloidosis.  She does not have any evidence to suggest multiple myeloma or amyloidosis.  These findings suggest asymptomatic MGUS at this time.  I recommended continued observation and surveillance at this time and repeat protein studies annually.  The natural course of this disease was reviewed with the chance of progressing into multiple myeloma around 1% a year.  If she develops any suggestion of multiple myeloma such as increasing her protein studies, cytopenias, electrolyte abnormalities or renal dysfunction we will complete the staging with a skeletal survey and a bone marrow biopsy.  2.  Plasma donation: I have no objections for her to undergo blood or plasma donation.  3.  Follow-up: Will be 12 months sooner if needed to.  15 minutes was spent face-to-face with the patient today.  More than 50% of today's encounter was dedicated to education, counseling and coordination of her future care.   Zola Button, MD 1/11/20191:14 PM

## 2017-08-29 NOTE — Telephone Encounter (Signed)
Gave avs and calendar for January 2020 °

## 2017-09-22 DIAGNOSIS — W231XXA Caught, crushed, jammed, or pinched between stationary objects, initial encounter: Secondary | ICD-10-CM | POA: Diagnosis not present

## 2017-09-22 DIAGNOSIS — I10 Essential (primary) hypertension: Secondary | ICD-10-CM | POA: Diagnosis not present

## 2017-09-22 DIAGNOSIS — R7309 Other abnormal glucose: Secondary | ICD-10-CM | POA: Diagnosis not present

## 2017-09-22 DIAGNOSIS — L608 Other nail disorders: Secondary | ICD-10-CM | POA: Diagnosis not present

## 2017-11-26 DIAGNOSIS — R220 Localized swelling, mass and lump, head: Secondary | ICD-10-CM | POA: Diagnosis not present

## 2017-11-26 DIAGNOSIS — Z0000 Encounter for general adult medical examination without abnormal findings: Secondary | ICD-10-CM | POA: Diagnosis not present

## 2017-11-26 DIAGNOSIS — Z1212 Encounter for screening for malignant neoplasm of rectum: Secondary | ICD-10-CM | POA: Diagnosis not present

## 2017-11-26 DIAGNOSIS — I10 Essential (primary) hypertension: Secondary | ICD-10-CM | POA: Diagnosis not present

## 2017-11-26 DIAGNOSIS — Z01419 Encounter for gynecological examination (general) (routine) without abnormal findings: Secondary | ICD-10-CM | POA: Diagnosis not present

## 2018-04-10 ENCOUNTER — Ambulatory Visit (INDEPENDENT_AMBULATORY_CARE_PROVIDER_SITE_OTHER): Payer: BLUE CROSS/BLUE SHIELD | Admitting: Cardiovascular Disease

## 2018-04-10 ENCOUNTER — Encounter: Payer: Self-pay | Admitting: Cardiovascular Disease

## 2018-04-10 ENCOUNTER — Encounter

## 2018-04-10 VITALS — BP 144/88 | HR 58 | Ht 67.0 in | Wt 235.0 lb

## 2018-04-10 DIAGNOSIS — I10 Essential (primary) hypertension: Secondary | ICD-10-CM | POA: Diagnosis not present

## 2018-04-10 DIAGNOSIS — F419 Anxiety disorder, unspecified: Secondary | ICD-10-CM

## 2018-04-10 MED ORDER — LORAZEPAM 0.5 MG PO TABS: 0.5000 mg | ORAL_TABLET | Freq: Three times a day (TID) | ORAL | 0 refills | Status: DC | PRN

## 2018-04-10 MED ORDER — ESCITALOPRAM OXALATE 10 MG PO TABS: 10.0000 mg | ORAL_TABLET | Freq: Every day | ORAL | 5 refills | Status: DC

## 2018-04-10 NOTE — Progress Notes (Signed)
Cardiology Office Note   Date:  04/10/2018   ID:  [name] [name], DOB [birth year], MRN [id number]  PCP:  [name] Chard, MD  Cardiologist:   Skeet Latch, MD   No chief complaint on file.     History of Present Illness: [name] [name] is a 57 y.o. female with hypertension and obesity who presents for follow up. She was initially seen 11/2016 for the evaluation of an abnormal EKG.  [name] [name] saw Dr. Glendale [name] and was noted to have an incomplete RBBB on EKG.  She also reported shortness of breath and was referred to cardiology for further evaluation.  Overall she was feeling well and didn't require an ischemia evaluation.  At that appointment her BP was noted to be elevated.  She wanted to work on diet and exercise before adding medication.  She has started exercising 2-3 times per week and notes that she has a little more energy.  She sometimes gets short of breath when walking up the stairs.  She notes occasional palpitations when she is anxious.  There is no associated chest pain, lightheadedness, shortness of breath or dizziness.   She has not been experiencing any lower extremity edema, orthopnea or PND.  She hasn't been checking her BP at home but takes all medications as prescribed.    At her last appointment [name] [name]'s blood pressure remained above goal.  She was also struggling from stress and anxiety.  Carvedilol was added to her regimen.  Her BP has been a little better still elevated.  Since then she continues to be very stressed.  This has been ongoing for the last 6 months to a year.  Her sons are both away (one is in [place] in the [employer]).  Her dog recently died.  Her mother has cancer and Alzheimer's and her sisters are arguing a lot.  She also has work stress.  She has been very tearful and not sleeping well.  She has lost interest in activities that previously gave her joy and often doesn't want to get out of bed or go to work.  She isn't htinking of harming herself  or others.  She doesn't like taking medication and has tried prayer without improvement. She is not exercising regularly anymore.  When she does exercise she likes to walk and does not have any chest pain or shortness of breath with this activity.  She denies any lower extremity edema, orthopnea, or PND.  Her blood pressure at home is been mostly running in the 140s over 80s to 90s.     Past Medical History:  Diagnosis Date  . Essential hypertension 12/07/2016  . Hypertension   . Morbid obesity ([place]) 12/07/2016  . Obesity 12/07/2016    Past Surgical History:  Procedure Laterality Date  . ABDOMINAL HYSTERECTOMY       Current Outpatient Medications  Medication Sig Dispense Refill  . carvedilol (COREG) 12.5 MG tablet Take 1 tablet (12.5 mg total) by mouth 2 (two) times daily. 60 tablet 11  . Cholecalciferol (VITAMIN D3) 3000 units TABS Take 3,000 Units by mouth daily.     Marland Kitchen GLUCOSA-CHONDR-NA CHONDR-MSM PO Take 1 capsule by mouth daily. 1500/1103 mg    . lisinopril-hydrochlorothiazide (PRINZIDE,ZESTORETIC) 20-25 MG per tablet Take 1 tablet by mouth daily. 90 tablet 3  . Multiple Vitamins-Minerals (CENTRUM SILVER PO) Take 1 tablet by mouth daily.    Marland Kitchen escitalopram (LEXAPRO) 10 MG tablet Take 1 tablet (10 mg total) by mouth daily. 30 tablet 5  .  LORazepam (ATIVAN) 0.5 MG tablet Take 1 tablet (0.5 mg total) by mouth every 8 (eight) hours as needed for anxiety. 30 tablet 0   No current facility-administered medications for this visit.     Allergies:   Patient has no known allergies.    Social History:  The patient  reports that she has never smoked. She has never used smokeless tobacco.   Family History:  The patient's family history includes Diabetes in her father; Heart attack in her father; Heart failure in her father; Hypertension in her mother.    ROS:  Please see the history of present illness.   Otherwise, review of systems are positive for hot flashes.   All other systems are  reviewed and negative.    PHYSICAL EXAM: VS:  BP (!) 144/88   Pulse (!) 58   Ht 5\' 7"  (1.702 m)   Wt 235 lb (106.6 kg)   BMI 36.81 kg/m  , BMI Body mass index is 36.81 kg/m. GENERAL:  Tearful.   HEENT: Pupils equal round and reactive, fundi not visualized, oral mucosa unremarkable NECK:  No jugular venous distention, waveform within normal limits, carotid upstroke brisk and symmetric, no bruits, no thyromegaly LYMPHATICS:  No cervical adenopathy LUNGS:  Clear to auscultation bilaterally HEART:  RRR.  PMI not displaced or sustained,S1 and S2 within normal limits, no S3, no S4, no clicks, no rubs, no murmurs ABD:  Flat, positive bowel sounds normal in frequency in pitch, no bruits, no rebound, no guarding, no midline pulsatile mass, no hepatomegaly, no splenomegaly EXT:  2 plus pulses throughout, no edema, no cyanosis no clubbing SKIN:  No rashes no nodules NEURO:  Cranial nerves II through XII grossly intact, motor grossly intact throughout PSYCH:  Cognitively intact, oriented to person place and time.  Depressed affect.    EKG:  EKG is ordered today. The ekg ordered today demonstrates sinus rhythm.  Rate 74 bpm.  Left axis deviation 10/27/16: Sinus rhythm. Rate 64 bpm. 04/10/18: Sinus bradycardia.  Rate 58 bpm.  LVH.  Non-specific ST changes.    Recent Labs: 08/22/2017: ALT 7; BUN 15.3; Creatinine 0.9; HGB 12.4; Platelets 201; Potassium 3.9; Sodium 139   10/30/16: Total cholesterol 194, triglycerides 77, HDL 60, LDL 120 Integument: A1c 5.4% Sodium 140, potassium 3.9, BUN 16, creatinine 0.95 WBC 5.2, hemoglobin 12.2, hematocrit 37.3, platelets 226  Lipid Panel No results found for: CHOL, TRIG, HDL, CHOLHDL, VLDL, LDLCALC, LDLDIRECT    Wt Readings from Last 3 Encounters:  04/10/18 235 lb (106.6 kg)  08/29/17 242 lb 8 oz (110 kg)  04/23/17 252 lb 3.2 oz (114.4 kg)      ASSESSMENT AND PLAN:  # Anxiety: # Depression: [name] [name] is very depressed and anxious.  She has  been going through a lot socially which is certainly contributing.  She is been trying to work with prayer but this is not helping.  After long discussion she is willing to try taking Lexapro 10 mg daily.  She understands that it will take 6 weeks to fully know if this is working.  In the meantime she will also have lorazepam 0.5 mg to be taken up to 3 times a day as needed.  # Hypertension: BP remains above goal.  Her depression and anxiety are certainly contributing.  Continue carvedilol, lisinopril, and hydrochlorthiazide.  We are going to work on her stress and anxiety, which will likely help her blood pressure as well.   Current medicines are reviewed at length with the  patient today.  The patient does not have concerns regarding medicines.  The following changes have been made: lorazepam and Lexapro.   Labs/ tests ordered today include:   Orders Placed This Encounter  Procedures  . EKG 12-Lead   Time spent: 45 minutes-Greater than 50% of this time was spent in counseling, explanation of diagnosis, planning of further management, and coordination of care.    Disposition:   FU with Baudelia Schroepfer C. Oval Linsey, MD, [facility] in 2 months     Signed, Amandeep Nesmith C. Oval Linsey, MD, [facility]  04/10/2018 5:24 PM    [place]

## 2018-04-10 NOTE — Patient Instructions (Signed)
Medication Instructions:  START ATIVAN (LORAZEPAM) 0.5 MG THREE TIMES A DAY AS NEEDED   START LEXAPRO 10 MG DAILY   Labwork: NONE  Testing/Procedures: NONE  Follow-Up: Your physician recommends that you schedule a follow-up appointment in: 2 MONTHS   If you need a refill on your cardiac medications before your next appointment, please call your pharmacy.

## 2018-05-02 ENCOUNTER — Encounter (HOSPITAL_COMMUNITY): Payer: Self-pay | Admitting: Emergency Medicine

## 2018-05-02 ENCOUNTER — Ambulatory Visit (HOSPITAL_COMMUNITY)
Admission: EM | Admit: 2018-05-02 | Discharge: 2018-05-02 | Disposition: A | Discharge status: Home / Self Care | Payer: BLUE CROSS/BLUE SHIELD | Attending: Internal Medicine | Admitting: Internal Medicine

## 2018-05-02 DIAGNOSIS — R51 Headache: Secondary | ICD-10-CM | POA: Diagnosis not present

## 2018-05-02 DIAGNOSIS — M542 Cervicalgia: Secondary | ICD-10-CM

## 2018-05-02 DIAGNOSIS — M546 Pain in thoracic spine: Secondary | ICD-10-CM

## 2018-05-02 DIAGNOSIS — R519 Headache, unspecified: Secondary | ICD-10-CM

## 2018-05-02 MED ORDER — CYCLOBENZAPRINE HCL 5 MG PO TABS: 5.0000 mg | ORAL_TABLET | Freq: Every evening | ORAL | 0 refills | Status: DC | PRN

## 2018-05-02 MED ORDER — MELOXICAM 7.5 MG PO TABS: 7.5000 mg | ORAL_TABLET | Freq: Every day | ORAL | 0 refills | Status: DC
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
## 2018-05-14 DIAGNOSIS — Z1331 Encounter for screening for depression: Secondary | ICD-10-CM

## 2018-05-14 DIAGNOSIS — M542 Cervicalgia: Secondary | ICD-10-CM

## 2018-05-14 DIAGNOSIS — M545 Low back pain: Secondary | ICD-10-CM

## 2018-05-25 ENCOUNTER — Other Ambulatory Visit: Payer: Self-pay | Admitting: Cardiovascular Disease

## 2018-05-30 DIAGNOSIS — Z0000 Encounter for general adult medical examination without abnormal findings: Secondary | ICD-10-CM | POA: Insufficient documentation

## 2018-05-30 DIAGNOSIS — I10 Essential (primary) hypertension: Secondary | ICD-10-CM | POA: Insufficient documentation

## 2018-06-04 ENCOUNTER — Ambulatory Visit: Payer: Self-pay | Admitting: Internal Medicine

## 2018-06-05 ENCOUNTER — Other Ambulatory Visit: Payer: Self-pay | Admitting: Cardiovascular Disease

## 2018-06-05 ENCOUNTER — Ambulatory Visit (INDEPENDENT_AMBULATORY_CARE_PROVIDER_SITE_OTHER): Payer: BLUE CROSS/BLUE SHIELD | Admitting: Cardiovascular Disease

## 2018-06-05 ENCOUNTER — Encounter: Payer: Self-pay | Admitting: Cardiovascular Disease

## 2018-06-05 VITALS — BP 138/90 | HR 69 | Ht 67.0 in | Wt 239.0 lb

## 2018-06-05 DIAGNOSIS — F419 Anxiety disorder, unspecified: Secondary | ICD-10-CM

## 2018-06-05 DIAGNOSIS — I10 Essential (primary) hypertension: Secondary | ICD-10-CM | POA: Diagnosis not present

## 2018-06-05 MED ORDER — AMLODIPINE BESYLATE 5 MG PO TABS: 5.0000 mg | ORAL_TABLET | Freq: Every day | ORAL | 3 refills | Status: DC
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
## 2018-06-05 NOTE — Patient Instructions (Signed)
Medication Instructions:  START AMLODIPINE 5 MG DAILY   If you need a refill on your cardiac medications before your next appointment, please call your pharmacy.   Lab work: NONE   Testing/Procedures: none  Follow-Up: At [facility], you and your health needs are our priority.  As part of our continuing mission to provide you with exceptional heart care, we have created designated Provider Care Teams.  These Care Teams include your primary Cardiologist (physician) and Advanced Practice Providers (APPs -  Physician Assistants and Nurse Practitioners) who all work together to provide you with the care you need, when you need it. You will need a follow up appointment in 4 months.  You may see DR [facility] or one of the following Advanced Practice Providers on your designated Care Team:   Kerin Ransom, PA-C Roby Lofts, [place] . Sande Rives, PA-C  Any Other Special Instructions Will Be Listed Below (If Applicable). TRY TO EXERCISE 150 MINUTES EACH WEEK  CALL [facility] FOR PRIMARY CARE [phone]

## 2018-06-05 NOTE — Telephone Encounter (Signed)
°*  STAT* If patient is at the pharmacy, call can be transferred to refill team.   1. Which medications need to be refilled? (please list name of each medication and dose if known) Lisinopril HCTZ, Carvedilol and Cyclobenzaprine 2. Which pharmacy/location (including street and city if local pharmacy) is medication to be sent to?[phone]  3. Do they need a 30 day or 90 day supply? 9Carvedilol 180 and refills the other two for 90 and refills

## 2018-06-08 MED ORDER — LISINOPRIL-HYDROCHLOROTHIAZIDE 20-25 MG PO TABS: 1.0000 | ORAL_TABLET | Freq: Every day | ORAL | 1 refills | Status: DC

## 2018-06-08 MED ORDER — CYCLOBENZAPRINE HCL 5 MG PO TABS: 5.0000 mg | ORAL_TABLET | Freq: Every evening | ORAL | 1 refills | Status: DC | PRN

## 2018-06-08 MED ORDER — CARVEDILOL 12.5 MG PO TABS: 12.5000 mg | ORAL_TABLET | Freq: Two times a day (BID) | ORAL | 1 refills | Status: DC

## 2018-06-16 ENCOUNTER — Ambulatory Visit: Payer: Self-pay | Admitting: Internal Medicine

## 2018-08-27 ENCOUNTER — Telehealth: Payer: Self-pay | Admitting: Oncology

## 2018-08-27 NOTE — Telephone Encounter (Signed)
Called patient per 1/9 sch message - unable to reach patient left message for patient to call back and r/s if still needed.

## 2018-08-28 ENCOUNTER — Inpatient Hospital Stay: Payer: BLUE CROSS/BLUE SHIELD | Attending: Oncology

## 2018-08-28 DIAGNOSIS — D472 Monoclonal gammopathy: Secondary | ICD-10-CM | POA: Insufficient documentation

## 2018-08-28 DIAGNOSIS — Z791 Long term (current) use of non-steroidal anti-inflammatories (NSAID): Secondary | ICD-10-CM | POA: Insufficient documentation

## 2018-08-28 DIAGNOSIS — Z79899 Other long term (current) drug therapy: Secondary | ICD-10-CM | POA: Insufficient documentation

## 2018-08-28 DIAGNOSIS — J069 Acute upper respiratory infection, unspecified: Secondary | ICD-10-CM | POA: Insufficient documentation

## 2018-09-04 ENCOUNTER — Inpatient Hospital Stay: Payer: BLUE CROSS/BLUE SHIELD

## 2018-09-04 ENCOUNTER — Telehealth: Payer: Self-pay

## 2018-09-04 ENCOUNTER — Inpatient Hospital Stay (HOSPITAL_BASED_OUTPATIENT_CLINIC_OR_DEPARTMENT_OTHER): Payer: BLUE CROSS/BLUE SHIELD | Admitting: Oncology

## 2018-09-04 VITALS — BP 134/76 | HR 70 | Temp 98.2°F | Resp 17 | Ht 67.0 in | Wt 247.1 lb

## 2018-09-04 DIAGNOSIS — D472 Monoclonal gammopathy: Secondary | ICD-10-CM

## 2018-09-04 DIAGNOSIS — Z79899 Other long term (current) drug therapy: Secondary | ICD-10-CM | POA: Diagnosis not present

## 2018-09-04 DIAGNOSIS — Z791 Long term (current) use of non-steroidal anti-inflammatories (NSAID): Secondary | ICD-10-CM | POA: Diagnosis not present

## 2018-09-04 DIAGNOSIS — J069 Acute upper respiratory infection, unspecified: Secondary | ICD-10-CM | POA: Diagnosis not present

## 2018-09-04 LAB — CBC WITH DIFFERENTIAL (CANCER CENTER ONLY)
Abs Immature Granulocytes: 0.01 10*3/uL (ref 0.00–0.07)
BASOS PCT: 0 %
Basophils Absolute: 0 10*3/uL (ref 0.0–0.1)
EOS ABS: 0.1 10*3/uL (ref 0.0–0.5)
Eosinophils Relative: 2 %
HCT: 37.2 % (ref 36.0–46.0)
Hemoglobin: 12 g/dL (ref 12.0–15.0)
IMMATURE GRANULOCYTES: 0 %
Lymphocytes Relative: 32 %
Lymphs Abs: 1.6 10*3/uL (ref 0.7–4.0)
MCH: 28.2 pg (ref 26.0–34.0)
MCHC: 32.3 g/dL (ref 30.0–36.0)
MCV: 87.3 fL (ref 80.0–100.0)
MONO ABS: 0.4 10*3/uL (ref 0.1–1.0)
MONOS PCT: 8 %
Neutro Abs: 2.9 10*3/uL (ref 1.7–7.7)
Neutrophils Relative %: 58 %
PLATELETS: 183 10*3/uL (ref 150–400)
RBC: 4.26 MIL/uL (ref 3.87–5.11)
RDW: 16 % — AB (ref 11.5–15.5)
WBC Count: 4.9 10*3/uL (ref 4.0–10.5)
nRBC: 0 % (ref 0.0–0.2)

## 2018-09-04 LAB — CMP (CANCER CENTER ONLY)
AST: 12 U/L — ABNORMAL LOW (ref 15–41)
Albumin: 4.1 g/dL (ref 3.5–5.0)
Alkaline Phosphatase: 51 U/L (ref 38–126)
Anion gap: 8 (ref 5–15)
BUN: 21 mg/dL — AB (ref 6–20)
CHLORIDE: 103 mmol/L (ref 98–111)
CO2: 31 mmol/L (ref 22–32)
CREATININE: 0.89 mg/dL (ref 0.44–1.00)
Calcium: 9.5 mg/dL (ref 8.9–10.3)
GFR, Estimated: >60 mL/min (ref >60)
Glucose, Bld: 68 mg/dL — ABNORMAL LOW (ref 70–99)
Potassium: 3.4 mmol/L — ABNORMAL LOW (ref 3.5–5.1)
SODIUM: 142 mmol/L (ref 135–145)
Total Bilirubin: 0.4 mg/dL (ref 0.3–1.2)
Total Protein: 8.2 g/dL — ABNORMAL HIGH (ref 6.5–8.1)

## 2018-09-04 NOTE — Telephone Encounter (Signed)
Printed avs and calender of upcoming appointment. Per 11/7 los 

## 2018-09-04 NOTE — Progress Notes (Signed)
Hematology and Oncology Follow Up Visit  [name] [name] [id number] [birth year] 58 y.o. 09/04/2018 11:58 AM [name], MDSanders, Bailey Mech, MD   Principle Diagnosis: 58 year old woman with MGUS diagnosed in 2018.  She presented with IgG M spike of 1 g/dL and IgG level of 1931.   Current therapy: Active surveillance.  Interim History: [name] returns today for a repeat evaluation.  Since last visit, she reports no major changes in her health.  She is dealing with upper respiratory tract infection with post nasal drip and cough.  Her cough is nonproductive without any fevers or dyspnea on exertion.  She denies any recent hospitalization or illnesses.  She denies any pathological fractures or bone pain.  Her performance status and activity level is unchanged.  She is not reporting any headaches, blurry vision, syncope or seizures.  She denies any changes in mentation or confusion.  She does not report any fevers, chills or sweats. She is not report any cough, wheezing or hemoptysis. She does not report any nausea, vomiting or early satiety.  She does not report any frequency urgency or hesitancy. She does not report any joint pain or deformity.  She does not report any heat or cold intolerance.  Denies any changes in mood.  Remaining review of systems unremarkable  Medications: I have reviewed the patient's current medications.  Current Outpatient Medications  Medication Sig Dispense Refill  . amLODipine (NORVASC) 5 MG tablet Take 1 tablet (5 mg total) by mouth daily. 90 tablet 3  . carvedilol (COREG) 12.5 MG tablet Take 1 tablet (12.5 mg total) by mouth 2 (two) times daily. 180 tablet 1  . Cholecalciferol (VITAMIN D3) 3000 units TABS Take 3,000 Units by mouth daily.     . cyclobenzaprine (FLEXERIL) 5 MG tablet Take 1 tablet (5 mg total) by mouth at bedtime as needed for muscle spasms. 90 tablet 1  . escitalopram (LEXAPRO) 10 MG tablet Take 1 tablet (10 mg total) by mouth daily. 30 tablet 5   . GLUCOSA-CHONDR-NA CHONDR-MSM PO Take 1 capsule by mouth daily. 1500/1103 mg    . lisinopril-hydrochlorothiazide (PRINZIDE,ZESTORETIC) 20-25 MG tablet Take 1 tablet by mouth daily. 90 tablet 1  . LORazepam (ATIVAN) 0.5 MG tablet Take 1 tablet (0.5 mg total) by mouth every 8 (eight) hours as needed for anxiety. 30 tablet 0  . meloxicam (MOBIC) 7.5 MG tablet Take 1 tablet (7.5 mg total) by mouth daily. 15 tablet 0  . Multiple Vitamins-Minerals (CENTRUM SILVER PO) Take 1 tablet by mouth daily.    . Probiotic Product (PROBIOTIC DAILY PO) Take 1 tablet by mouth daily.     No current facility-administered medications for this visit.      Allergies: No Known Allergies  Past Medical History, Surgical history, Social history, and Family History were reviewed and updated.  Marland Kitchen Physical Exam: Blood pressure 134/76, pulse 70, temperature 98.2 F (36.8 C), temperature source Oral, resp. rate 17, height 5' 7" (1.702 m), weight 247 lb 1.6 oz (112.1 kg), SpO2 99 %. ECOG: 0   General appearance: Comfortable appearing without any discomfort Head: Normocephalic without any trauma Oropharynx: Mucous membranes are moist and pink without any thrush or ulcers. Eyes: Pupils are equal and round reactive to light. Lymph nodes: No cervical, supraclavicular, inguinal or axillary lymphadenopathy.   Heart:regular rate and rhythm.  S1 and S2 without leg edema. Lung: Clear without any rhonchi or wheezes.  No dullness to percussion. Abdomin: Soft, nontender, nondistended with good bowel sounds.  No hepatosplenomegaly. Musculoskeletal: No joint  deformity or effusion.  Full range of motion noted. Neurological: No deficits noted on motor, sensory and deep tendon reflex exam. Skin: No petechial rash or dryness.  Appeared moist.    Lab Results: Lab Results  Component Value Date   WBC 5.1 08/22/2017   HGB 12.4 08/22/2017   HCT 37.7 08/22/2017   MCV 85.2 08/22/2017   PLT 201 08/22/2017     Chemistry       Component Value Date/Time   NA 139 08/22/2017 1254   K 3.9 08/22/2017 1254   CL 102 01/18/2013 1253   CO2 31 (H) 08/22/2017 1254   BUN 15.3 08/22/2017 1254   CREATININE 0.9 08/22/2017 1254      Component Value Date/Time   CALCIUM 9.3 08/22/2017 1254   ALKPHOS 52 08/22/2017 1254   AST 15 08/22/2017 1254   ALT 7 08/22/2017 1254   BILITOT 0.51 08/22/2017 1254     Results for [name], [name] (MRN [id number]) as of 09/04/2018 11:58  Ref. Range 08/22/2017 12:54  M Protein SerPl Elph-Mcnc Latest Ref Range: Not Observed g/dL 1.0 (H)  IFE 1 Unknown Comment  Globulin, Total Latest Ref Range: 2.2 - 3.9 g/dL 4.0 (H)  B-Globulin SerPl Elph-Mcnc Latest Ref Range: 0.7 - 1.3 g/dL 1.0  IgG (Immunoglobin G), Serum Latest Ref Range: 700 - 1600 mg/dL 1,931 (H)  IgM, Qn, Serum Latest Ref Range: 26 - 217 mg/dL 94     Impression and Plan:  58 year old woman with the following issues:  1.  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  She is currently on active surveillance without any indication for treatment or signs of multiple myeloma.  Protein studies in January 2019 showed an M spike of 1.0 g/dL and IgG level of 1900 which has not changed at this time.  The natural course of her disease was discussed today and risk of progression to multiple myeloma was reviewed.  At this time I recommended continued annual surveillance with repeat protein studies at that time.  She is agreeable at this time.  2.  Follow-up: Will be 12 months.   15 minutes was spent face-to-face with the patient today.  More than 50% of today's encounter was dedicated to reviewing laboratory data, updating disease status and coordinating plan of care.   Zola Button, MD 1/17/202011:58 AM

## 2018-09-07 LAB — KAPPA/LAMBDA LIGHT CHAINS
KAPPA FREE LGHT CHN: 12.8 mg/L (ref 3.3–19.4)
KAPPA, LAMDA LIGHT CHAIN RATIO: 1.07 (ref 0.26–1.65)
LAMDA FREE LIGHT CHAINS: 12 mg/L (ref 5.7–26.3)

## 2018-09-08 LAB — MULTIPLE MYELOMA PANEL, SERUM
ALPHA 1: 0.3 g/dL (ref 0.0–0.4)
ALPHA2 GLOB SERPL ELPH-MCNC: 0.8 g/dL (ref 0.4–1.0)
Albumin SerPl Elph-Mcnc: 3.8 g/dL (ref 2.9–4.4)
Albumin/Glob SerPl: 1.1 (ref 0.7–1.7)
B-Globulin SerPl Elph-Mcnc: 0.9 g/dL (ref 0.7–1.3)
Gamma Glob SerPl Elph-Mcnc: 1.7 g/dL (ref 0.4–1.8)
Globulin, Total: 3.7 g/dL (ref 2.2–3.9)
IGA: 98 mg/dL (ref 87–352)
IGG (IMMUNOGLOBIN G), SERUM: 1812 mg/dL — AB (ref 700–1600)
IGM (IMMUNOGLOBULIN M), SRM: 80 mg/dL (ref 26–217)
M Protein SerPl Elph-Mcnc: 1.1 g/dL — ABNORMAL HIGH
TOTAL PROTEIN ELP: 7.5 g/dL (ref 6.0–8.5)

## 2018-10-12 ENCOUNTER — Ambulatory Visit (INDEPENDENT_AMBULATORY_CARE_PROVIDER_SITE_OTHER): Payer: BLUE CROSS/BLUE SHIELD | Admitting: Cardiovascular Disease

## 2018-10-12 ENCOUNTER — Encounter: Payer: Self-pay | Admitting: Cardiovascular Disease

## 2018-10-12 VITALS — BP 126/82 | HR 69 | Ht 67.0 in | Wt 236.0 lb

## 2018-10-12 DIAGNOSIS — E876 Hypokalemia: Secondary | ICD-10-CM

## 2018-10-12 DIAGNOSIS — F419 Anxiety disorder, unspecified: Secondary | ICD-10-CM | POA: Diagnosis not present

## 2018-10-12 DIAGNOSIS — I10 Essential (primary) hypertension: Secondary | ICD-10-CM

## 2018-10-12 NOTE — Progress Notes (Signed)
Cardiology Office Note   Date:  10/12/2018   ID:  [name] [name], DOB March 13, 1961, MRN [id number]  PCP:  [name] Chard, MD  Cardiologist:   [name] Latch, MD   No chief complaint on file.     History of Present Illness: [name] [name] is a 58 y.o. female with hypertension and obesity who presents for follow up. She was initially seen 11/2016 for the evaluation of an abnormal EKG.  Ms. [name] [name] saw Dr. Glendale [name] and was noted to have an incomplete RBBB on EKG.  She also reported shortness of breath and was referred to cardiology for further evaluation.  Overall she was feeling well and didn't require an ischemia evaluation.  At that appointment her BP was noted to be elevated.  She wanted to work on diet and exercise before adding medication.  She has started exercising 2-3 times per week she reported some exertional dyspnea and palpitations.  Given that her blood pressure remained elevated carvedilol was added to her regimen.  At the time she also had many work and family stressors.  (mother with cancer and Alzheimer's, sisters arguing, son in [place] in [place], [place] died).  She was started on Lexapro, which has helped.  She   At her last appointment [name] [name]'s blood pressure was elevated so amlodipine was added to her regimen.  Since then her blood pressure has been better-controlled.  She has also been working on her diet.  She is limiting red meat, eating low-glycemic fruit, and limiting her carbohydrate intake.  She exercises approximately twice per week which is increased from none in the past.  She has no exertional chest pain or shortness of breath.  She notes that her mood has been better and her job situation is improved.  She has not needed to take any lorazepam lately.  She denies lower extremity edema, orthopnea, or PND.  She does note cramping in her legs.   Past Medical History:  Diagnosis Date  . Abnormal glucose   . Candidiasis   . Cervicalgia   . Essential  hypertension 12/07/2016  . Flatulence   . Hypertension   . Localized swelling, mass and lump, head   . Lumbago   . Morbid obesity ([place]) 12/07/2016  . Obesity 12/07/2016  . Onychomycosis     Past Surgical History:  Procedure Laterality Date  . ABDOMINAL HYSTERECTOMY       Current Outpatient Medications  Medication Sig Dispense Refill  . amLODipine (NORVASC) 5 MG tablet Take 1 tablet (5 mg total) by mouth daily. 90 tablet 3  . amoxicillin (AMOXIL) 500 MG capsule TAKE 1 CAPSULE BY MOUTH EVERY 8 HOURS UNTIL ALL TAKEN    . carvedilol (COREG) 12.5 MG tablet Take 1 tablet (12.5 mg total) by mouth 2 (two) times daily. 180 tablet 1  . chlorhexidine (PERIDEX) 0.12 % solution RINSE IN MOUTH TWICE DAILY IN THE MORNING AND BEFORE BEDTIME FOR 30 SECONDS    . Cholecalciferol (VITAMIN D3) 3000 units TABS Take 3,000 Units by mouth daily.     . cyclobenzaprine (FLEXERIL) 5 MG tablet Take 1 tablet (5 mg total) by mouth at bedtime as needed for muscle spasms. 90 tablet 1  . escitalopram (LEXAPRO) 10 MG tablet Take 1 tablet (10 mg total) by mouth daily. 30 tablet 5  . GLUCOSA-CHONDR-NA CHONDR-MSM PO Take 1 capsule by mouth daily. 1500/1103 mg    . lisinopril-hydrochlorothiazide (PRINZIDE,ZESTORETIC) 20-25 MG tablet Take 1 tablet by mouth daily. 90 tablet 1  . LORazepam (ATIVAN)  0.5 MG tablet Take 1 tablet (0.5 mg total) by mouth every 8 (eight) hours as needed for anxiety. 30 tablet 0  . meloxicam (MOBIC) 7.5 MG tablet Take 1 tablet (7.5 mg total) by mouth daily. 15 tablet 0  . Multiple Vitamins-Minerals (CENTRUM SILVER PO) Take 1 tablet by mouth daily.    . Probiotic Product (PROBIOTIC DAILY PO) Take 1 tablet by mouth daily.     No current facility-administered medications for this visit.     Allergies:   Patient has no known allergies.    Social History:  The patient  reports that she has never smoked. She has never used smokeless tobacco.   Family History:  The patient's family history includes  Diabetes in her father; Heart attack in her father; Heart failure in her father; Hypertension in her mother.    ROS:  Please see the history of present illness.   Otherwise, review of systems are positive for hot flashes.   All other systems are reviewed and negative.    PHYSICAL EXAM: VS:  BP 126/82   Pulse 69   Ht 5\' 7"  (1.702 m)   Wt 236 lb (107 kg)   BMI 36.96 kg/m  , BMI Body mass index is 36.96 kg/m. GENERAL:  Well appearing HEENT: Pupils equal round and reactive, fundi not visualized, oral mucosa unremarkable NECK:  No jugular venous distention, waveform within normal limits, carotid upstroke brisk and symmetric, no bruits LUNGS:  Clear to auscultation bilaterally HEART:  RRR.  PMI not displaced or sustained,S1 and S2 within normal limits, no S3, no S4, no clicks, no rubs, no murmurs ABD:  Flat, positive bowel sounds normal in frequency in pitch, no bruits, no rebound, no guarding, no midline pulsatile mass, no hepatomegaly, no splenomegaly EXT:  2 plus pulses throughout, no edema, no cyanosis no clubbing SKIN:  No rashes no nodules NEURO:  Cranial nerves II through XII grossly intact, motor grossly intact throughout PSYCH:  Cognitively intact, oriented to person place and time   EKG:  EKG is ordered today. The ekg ordered today demonstrates sinus rhythm.  Rate 74 bpm.  Left axis deviation 10/27/16: Sinus rhythm. Rate 64 bpm. 04/10/18: Sinus bradycardia.  Rate 58 bpm.  LVH.  Non-specific ST changes.  10/12/18: Sinus rhythm.  Rate 69 bpm.  PVCs.    Recent Labs: 09/04/2018: ALT <6; BUN 21; Creatinine 0.89; Hemoglobin 12.0; Platelet Count 183; Potassium 3.4; Sodium 142   10/30/16: Total cholesterol 194, triglycerides 77, HDL 60, LDL 120 Integument: A1c 5.4% Sodium 140, potassium 3.9, BUN 16, creatinine 0.95 WBC 5.2, hemoglobin 12.2, hematocrit 37.3, platelets 226  Lipid Panel No results found for: CHOL, TRIG, HDL, CHOLHDL, VLDL, LDLCALC, LDLDIRECT    Wt Readings from Last  3 Encounters:  10/12/18 236 lb (107 kg)  09/04/18 247 lb 1.6 oz (112.1 kg)  06/05/18 239 lb (108.4 kg)      ASSESSMENT AND PLAN:  # Hypertension: BP better controlled.  Continue amlodipine, carvedilol, HCTZ and lisionpril.  # Leg cramps: # PVCs:  Likely 2/2 hypokalemia.  We discussed starting a potassium supplement but she would rather increase her potassium rich foods.  She will work on this and check a BMP in 1 month.  # Anxiety: # Depression: Ms. Baynes is doing much better.  Continue Lexapro.   Current medicines are reviewed at length with the patient today.  The patient does not have concerns regarding medicines.  The following changes have been made: none  Labs/ tests ordered today  include:   Orders Placed This Encounter  Procedures  . Basic metabolic panel  . EKG 12-Lead     Disposition:   FU with Wilhemenia Camba C. Oval Linsey, MD, [facility] in 1 year    Signed, Shala Baumbach C. Oval Linsey, MD, [facility]  10/12/2018 12:44 PM    [place]

## 2018-10-12 NOTE — Patient Instructions (Addendum)
Medication Instructions:  Your physician recommends that you continue on your current medications as directed. Please refer to the Current Medication list given to you today.  If you need a refill on your cardiac medications before your next appointment, please call your pharmacy.   Lab work: BMET IN 1 MONTH 11/09/18  If you have labs (blood work) drawn today and your tests are completely normal, you will receive your results only by: Marland Kitchen MyChart Message (if you have MyChart) OR . A paper copy in the mail If you have any lab test that is abnormal or we need to change your treatment, we will call you to review the results.  Testing/Procedures: NONE  Follow-Up: At [facility], you and your health needs are our priority.  As part of our continuing mission to provide you with exceptional heart care, we have created designated Provider Care Teams.  These Care Teams include your primary Cardiologist (physician) and Advanced Practice Providers (APPs -  Physician Assistants and Nurse Practitioners) who all work together to provide you with the care you need, when you need it. You will need a follow up appointment in 12 months.  Please call our office 2 months in advance to schedule this appointment.  You may see DR [facility] or one of the following Advanced Practice Providers on your designated Care Team:   Kerin Ransom, PA-C Roby Lofts, [place] . Sande Rives, PA-C  Any Other Special Instructions Will Be Listed Below (If Applicable)  EAT HIGH POTASSIUM FOODS    Potassium Content of Foods  Potassium is a mineral found in many foods and drinks. It affects how the heart works, and helps keep fluids and minerals balanced in the body. The amount of potassium you need each day depends on your age and any medical conditions you may have. Talk to your health care provider or dietitian about how much potassium you need. The following lists of foods provide the general serving size for foods and the  approximate amount of potassium in each serving, listed in milligrams (mg). Actual values may vary depending on the product and how it is processed. High in potassium The following foods and beverages have 200 mg or more of potassium per serving:  Apricots (raw) - 2 have 200 mg of potassium.  Apricots (dry) - 5 have 200 mg of potassium.  Artichoke - 1 medium has 345 mg of potassium.  Avocado -  fruit has 245 mg of potassium.  Banana - 1 medium fruit has 425 mg of potassium.  [place] or baked beans (canned) -  cup has 280 mg of potassium.  White beans (canned) -  cup has 595 mg potassium.  Beef roast - 3 oz has 320 mg of potassium.  Ground beef - 3 oz has 270 mg of potassium.  Beets (raw or cooked) -  cup has 260 mg of potassium.  Bran muffin - 2 oz has 300 mg of potassium.  Broccoli (cooked) -  cup has 230 mg of potassium.  Brussels sprouts -  cup has 250 mg of potassium.  Cantaloupe -  cup has 215 mg of potassium.  Cereal, 100% bran -  cup has 200-400 mg of potassium.  Cheeseburger -1 single fast food burger has 225-400 mg of potassium.  Chicken - 3 oz has 220 mg of potassium.  Clams (canned) - 3 oz has 535 mg of potassium.  Crab - 3 oz has 225 mg of potassium.  Dates - 5 have 270 mg of potassium.  Dried beans and peas -  cup has 300-475 mg of potassium.  Figs (dried) - 2 have 260 mg of potassium.  Fish (halibut, tuna, cod, snapper) - 3 oz has 480 mg of potassium.  Fish (salmon, haddock, swordfish, perch) - 3 oz has 300 mg of potassium.  Fish (tuna, canned) - 3 oz has 200 mg of potassium.  [place] fries (fast food) - 3 oz has 470 mg of potassium.  Granola with fruit and nuts -  cup has 200 mg of potassium.  Grapefruit juice -  cup has 200 mg of potassium.  Honeydew melon -  cup has 200 mg of potassium.  Kale (raw) - 1 cup has 300 mg of potassium.  Kiwi - 1 medium fruit has 240 mg of potassium.  Kohlrabi, rutabaga, parsnips -  cup has 280 mg  of potassium.  Lentils -  cup has 365 mg of potassium.  Mango - 1 each has 325 mg of potassium.  Milk (nonfat, low-fat, whole, buttermilk) - 1 cup has 350-380 mg of potassium.  Milk (chocolate) - 1 cup has 420 mg of potassium  Molasses - 1 Tbsp has 295 mg of potassium.  Mushrooms -  cup has 280 mg of potassium.  Nectarine - 1 each has 275 mg of potassium.  Nuts (almonds, peanuts, hazelnuts, [place], cashew, mixed) - 1 oz has 200 mg of potassium.  Nuts (pistachios) - 1 oz has 295 mg of potassium.  Orange - 1 fruit has 240 mg of potassium.  Orange juice -  cup has 235 mg of potassium.  Papaya -  medium fruit has 390 mg of potassium.  Peanut butter (chunky) - 2 Tbsp has 240 mg of potassium.  Peanut butter (smooth) - 2 Tbsp has 210 mg of potassium.  Pear - 1 medium (200 mg) of potassium.  Pomegranate - 1 whole fruit has 400 mg of potassium.  Pomegranate juice -  cup has 215 mg of potassium.  Pork - 3 oz has 350 mg of potassium.  Potato chips (salted) - 1 oz has 465 mg of potassium.  Potato (baked with skin) - 1 medium has 925 mg of potassium.  Potato (boiled) -  cup has 255 mg of potassium.  Potato (Mashed) -  cup has 330 mg of potassium.  Prune juice -  cup has 370 mg of potassium.  Prunes - 5 have 305 mg of potassium.  Pudding (chocolate) -  cup has 230 mg of potassium.  Pumpkin (canned) -  cup has 250 mg of potassium.  Raisins (seedless) -  cup has 270 mg of potassium.  Seeds (sunflower or pumpkin) - 1 oz has 240 mg of potassium.  Soy milk - 1 cup has 300 mg of potassium.  Spinach (cooked) - 1/2 cup has 420 mg of potassium.  Spinach (canned) -  cup has 370 mg of potassium.  Sweet potato (baked with skin) - 1 medium has 450 mg of potassium.  Swiss chard -  cup has 480 mg of potassium.  Tomato or vegetable juice -  cup has 275 mg of potassium.  Tomato (sauce or puree) -  cup has 400-550 mg of potassium.  Tomato (raw) - 1 medium has  290 mg of potassium.  Tomato (canned) -  cup has 200-300 mg of potassium.  [place] - 3 oz has 250 mg of potassium.  Wheat germ - 1 oz has 250 mg of potassium.  Winter squash -  cup has 250 mg of potassium.  Yogurt (plain  or fruited) - 6 oz has 260-435 mg of potassium.  Zucchini -  cup has 220 mg of potassium. Moderate in potassium The following foods and beverages have 50-200 mg of potassium per serving:  Apple - 1 fruit has 150 mg of potassium  Apple juice -  cup has 150 mg of potassium  Applesauce -  cup has 90 mg of potassium  Apricot nectar -  cup has 140 mg of potassium  Asparagus (small spears) -  cup has 155 mg of potassium  Asparagus (large spears) - 6 have 155 mg of potassium  Bagel (cinnamon raisin) - 1 four-inch bagel has 130 mg of potassium  Bagel (egg or plain) - 1 four- inch bagel has 70 mg of potassium  Beans (green) -  cup has 90 mg of potassium  Beans (yellow) -  cup has 190 mg of potassium  Beer, regular - 12 oz has 100 mg of potassium  Beets (canned) -  cup has 125 mg of potassium  Blackberries -  cup has 115 mg of potassium  Blueberries -  cup has 60 mg of potassium  Bread (whole wheat) - 1 slice has 70 mg of potassium  Broccoli (raw) -  cup has 145 mg of potassium  Cabbage -  cup has 150 mg of potassium  Carrots (cooked or raw) -  cup has 180 mg of potassium  Cauliflower (raw) -  cup has 150 mg of potassium  Celery (raw) -  cup has 155 mg of potassium  Cereal, bran flakes -  cup has 120-150 mg of potassium  Cheese (cottage) -  cup has 110 mg of potassium  Cherries - 10 have 150 mg of potassium  Chocolate - 1 oz bar has 165 mg of potassium  Coffee (brewed) - 6 oz has 90 mg of potassium  Corn -  cup or 1 ear has 195 mg of potassium  Cucumbers -  cup has 80 mg of potassium  Egg - 1 large egg has 60 mg of potassium  Eggplant -  cup has 60 mg of potassium  Endive (raw) -  cup has 80 mg of  potassium  English muffin - 1 has 65 mg of potassium  Fish (ocean perch) - 3 oz has 192 mg of potassium  Frankfurter, beef or pork - 1 has 75 mg of potassium  Fruit cocktail -  cup has 115 mg of potassium  Grape juice -  cup has 170 mg of potassium  Grapefruit -  fruit has 175 mg of potassium  Grapes -  cup has 155 mg of potassium  Greens: kale, turnip, collard -  cup has 110-150 mg of potassium  Ice cream or frozen yogurt (chocolate) -  cup has 175 mg of potassium  Ice cream or frozen yogurt (vanilla) -  cup has 120-150 mg of potassium  Lemons, limes - 1 each has 80 mg of potassium  Lettuce - 1 cup has 100 mg of potassium  Mixed vegetables -  cup has 150 mg of potassium  Mushrooms, raw -  cup has 110 mg of potassium  Nuts (walnuts, pecans, or macadamia) - 1 oz has 125 mg of potassium  Oatmeal -  cup has 80 mg of potassium  Okra -  cup has 110 mg of potassium  Onions -  cup has 120 mg of potassium  Peach - 1 has 185 mg of potassium  Peaches (canned) -  cup has 120 mg of potassium  Pears (canned) -  cup has 120 mg of potassium  Peas, green (frozen) -  cup has 90 mg of potassium  Peppers (Green) -  cup has 130 mg of potassium  Peppers (Red) -  cup has 160 mg of potassium  Pineapple juice -  cup has 165 mg of potassium  Pineapple (fresh or canned) -  cup has 100 mg of potassium  Plums - 1 has 105 mg of potassium  Pudding, vanilla -  cup has 150 mg of potassium  Raspberries -  cup has 90 mg of potassium  Rhubarb -  cup has 115 mg of potassium  Rice, wild -  cup has 80 mg of potassium  Shrimp - 3 oz has 155 mg of potassium  Spinach (raw) - 1 cup has 170 mg of potassium  Strawberries -  cup has 125 mg of potassium  Summer squash -  cup has 175-200 mg of potassium  Swiss chard (raw) - 1 cup has 135 mg of potassium  Tangerines - 1 fruit has 140 mg of potassium  Tea, brewed - 6 oz has 65 mg of potassium  Turnips -  cup has  140 mg of potassium  Watermelon -  cup has 85 mg of potassium  Wine (Red, table) - 5 oz has 180 mg of potassium  Wine (White, table) - 5 oz 100 mg of potassium Low in potassium The following foods and beverages have less than 50 mg of potassium per serving.  Bread (white) - 1 slice has 30 mg of potassium  Carbonated beverages - 12 oz has less than 5 mg of potassium  Cheese - 1 oz has 20-30 mg of potassium  Cranberries -  cup has 45 mg of potassium  Cranberry juice cocktail -  cup has 20 mg of potassium  Fats and oils - 1 Tbsp has less than 5 mg of potassium  Hummus - 1 Tbsp has 32 mg of potassium  Nectar (papaya, mango, or pear) -  cup has 35 mg of potassium  Rice (white or brown) -  cup has 50 mg of potassium  Spaghetti or macaroni (cooked) -  cup has 30 mg of potassium  Tortilla, flour or corn - 1 has 50 mg of potassium  Waffle - 1 four-inch waffle has 50 mg of potassium  Water chestnuts -  cup has 40 mg of potassium Summary  Potassium is a mineral found in many foods and drinks. It affects how the heart works, and helps keep fluids and minerals balanced in the body.  The amount of potassium you need each day depends on your age and any existing medical conditions you may have. Your health care provider or dietitian may recommend an amount of potassium that you should have each day. This information is not intended to replace advice given to you by your health care provider. Make sure you discuss any questions you have with your health care provider. Document Released: 03/19/2005 Document Revised: 10/30/2016 Document Reviewed: 10/30/2016 Elsevier Interactive Patient Education  2019 [employer].

## 2018-11-16 ENCOUNTER — Other Ambulatory Visit: Payer: Self-pay | Admitting: Cardiovascular Disease

## 2018-12-11 ENCOUNTER — Other Ambulatory Visit: Payer: Self-pay | Admitting: Cardiovascular Disease

## 2018-12-11 NOTE — Telephone Encounter (Signed)
Zestoric 20-25 refilled.

## 2018-12-22 ENCOUNTER — Other Ambulatory Visit: Payer: Self-pay | Admitting: Cardiovascular Disease

## 2018-12-23 NOTE — Telephone Encounter (Signed)
OK to refill

## 2018-12-24 ENCOUNTER — Encounter: Payer: Self-pay | Admitting: Internal Medicine

## 2019-02-10 ENCOUNTER — Encounter: Payer: Self-pay | Admitting: Internal Medicine

## 2019-02-10 ENCOUNTER — Ambulatory Visit (INDEPENDENT_AMBULATORY_CARE_PROVIDER_SITE_OTHER): Payer: BC Managed Care – PPO | Admitting: Internal Medicine

## 2019-02-10 ENCOUNTER — Other Ambulatory Visit: Payer: Self-pay

## 2019-02-10 VITALS — BP 116/82 | HR 74 | Temp 98.8°F | Ht 67.0 in | Wt 252.0 lb

## 2019-02-10 DIAGNOSIS — M545 Low back pain, unspecified: Secondary | ICD-10-CM

## 2019-02-10 DIAGNOSIS — G8929 Other chronic pain: Secondary | ICD-10-CM | POA: Diagnosis not present

## 2019-02-10 DIAGNOSIS — Z6839 Body mass index (BMI) 39.0-39.9, adult: Secondary | ICD-10-CM

## 2019-02-10 DIAGNOSIS — R05 Cough: Secondary | ICD-10-CM | POA: Diagnosis not present

## 2019-02-10 DIAGNOSIS — I10 Essential (primary) hypertension: Secondary | ICD-10-CM

## 2019-02-10 DIAGNOSIS — R059 Cough, unspecified: Secondary | ICD-10-CM

## 2019-02-10 MED ORDER — CHLORHEXIDINE GLUCONATE 0.12 % MT SOLN: 5.0000 mL | Freq: Two times a day (BID) | OROMUCOSAL | 1 refills | Status: DC

## 2019-02-10 MED ORDER — OLMESARTAN MEDOXOMIL-HCTZ 20-12.5 MG PO TABS: 1.0000 | ORAL_TABLET | Freq: Every day | ORAL | 1 refills | Status: DC

## 2019-02-10 MED ORDER — ESCITALOPRAM OXALATE 10 MG PO TABS: 10.0000 mg | ORAL_TABLET | Freq: Every day | ORAL | 1 refills | Status: DC

## 2019-02-10 MED ORDER — CYCLOBENZAPRINE HCL 5 MG PO TABS: 5.0000 mg | ORAL_TABLET | Freq: Every evening | ORAL | 1 refills | Status: DC | PRN

## 2019-02-10 NOTE — Patient Instructions (Signed)
Olmesartan tablets What is this medicine? OLMESARTAN (all mi SAR tan) is used to treat high blood pressure. This medicine may be used for other purposes; ask your health care provider or pharmacist if you have questions. COMMON BRAND NAME(S): Benicar What should I tell my health care provider before I take this medicine? They need to know if you have any of these conditions: -if you are on a special diet, such as a low-salt diet -kidney or liver disease -an unusual or allergic reaction to olmesartan, other medicines, foods, dyes, or preservatives -pregnant or trying to get pregnant -breast-feeding How should I use this medicine? Take this medicine by mouth with a glass of water. Follow the directions on the prescription label. This medicine can be taken with or without food. Take your doses at regular intervals. Do not take your medicine more often than directed. Do not stop taking except on the advice of your doctor or health care professional. Talk to your pediatrician regarding the use of this medicine in children. While this drug may be prescribed for children as young as 6 years for selected conditions, precautions do apply. Overdosage: If you think you have taken too much of this medicine contact a poison control center or emergency room at once. NOTE: This medicine is only for you. Do not share this medicine with others. What if I miss a dose? If you miss a dose, take it as soon as you can. If it is almost time for your next dose, take only that dose. Do not take double or extra doses. What may interact with this medicine? -blood pressure medicines -diuretics, especially triamterene, spironolactone or amiloride -potassium salts or potassium supplements This list may not describe all possible interactions. Give your health care provider a list of all the medicines, herbs, non-prescription drugs, or dietary supplements you use. Also tell them if you smoke, drink alcohol, or use illegal  drugs. Some items may interact with your medicine. What should I watch for while using this medicine? Visit your doctor or health care professional for regular checks on your progress. Check your blood pressure as directed. Ask your doctor or health care professional what your blood pressure should be and when you should contact him or her. Call your doctor or health care professional if you notice an irregular or fast heart beat. Women should inform their doctor if they wish to become pregnant or think they might be pregnant. There is a potential for serious side effects to an unborn child, particularly in the second or third trimester. Talk to your health care professional or pharmacist for more information. You may get drowsy or dizzy. Do not drive, use machinery, or do anything that needs mental alertness until you know how this drug affects you. Do not stand or sit up quickly, especially if you are an older patient. This reduces the risk of dizzy or fainting spells. Alcohol can make you more drowsy and dizzy. Avoid alcoholic drinks. Avoid salt substitutes unless you are told otherwise by your doctor or health care professional. Do not treat yourself for coughs, colds, or pain while you are taking this medicine without asking your doctor or health care professional for advice. Some ingredients may increase your blood pressure. What side effects may I notice from receiving this medicine? Side effects that you should report to your doctor or health care professional as soon as possible: -confusion, dizziness, light headedness or fainting spells -decreased amount of urine passed -diarrhea -difficulty breathing or swallowing, hoarseness, or  tightening of the throat -fast or irregular heart beat, palpitations, or chest pain -skin rash, itching -swelling of your face, lips, tongue, hands, or feet -vomiting -weight loss Side effects that usually do not require medical attention (report to your doctor  or health care professional if they continue or are bothersome): -cough -decreased sexual function or desire -headache -nasal congestion or stuffiness -nausea -sore or cramping muscles This list may not describe all possible side effects. Call your doctor for medical advice about side effects. You may report side effects to FDA at 1-800-FDA-1088. Where should I keep my medicine? Keep out of the reach of children. Store your medicine at room temperature between 20 and 25 degrees C (68 and 77 degrees F). Throw away any unused medicine after the expiration date. NOTE: This sheet is a summary. It may not cover all possible information. If you have questions about this medicine, talk to your doctor, pharmacist, or health care provider.  2019 Elsevier/Gold Standard (2012-02-19 13:02:23)   Exercising to Lose Weight Exercise is structured, repetitive physical activity to improve fitness and health. Getting regular exercise is important for everyone. It is especially important if you are overweight. Being overweight increases your risk of heart disease, stroke, diabetes, high blood pressure, and several types of cancer. Reducing your calorie intake and exercising can help you lose weight. Exercise is usually categorized as moderate or vigorous intensity. To lose weight, most people need to do a certain amount of moderate-intensity or vigorous-intensity exercise each week. Moderate-intensity exercise  Moderate-intensity exercise is any activity that gets you moving enough to burn at least three times more energy (calories) than if you were sitting. Examples of moderate exercise include:  Walking a mile in 15 minutes.  Doing light yard work.  Biking at an easy pace. Most people should get at least 150 minutes (2 hours and 30 minutes) a week of moderate-intensity exercise to maintain their body weight. Vigorous-intensity exercise Vigorous-intensity exercise is any activity that gets you moving  enough to burn at least six times more calories than if you were sitting. When you exercise at this intensity, you should be working hard enough that you are not able to carry on a conversation. Examples of vigorous exercise include:  Running.  Playing a team sport, such as football, basketball, and soccer.  Jumping rope. Most people should get at least 75 minutes (1 hour and 15 minutes) a week of vigorous-intensity exercise to maintain their body weight. How can exercise affect me? When you exercise enough to burn more calories than you eat, you lose weight. Exercise also reduces body fat and builds muscle. The more muscle you have, the more calories you burn. Exercise also:  Improves mood.  Reduces stress and tension.  Improves your overall fitness, flexibility, and endurance.  Increases bone strength. The amount of exercise you need to lose weight depends on:  Your age.  The type of exercise.  Any health conditions you have.  Your overall physical ability. Talk to your health care provider about how much exercise you need and what types of activities are safe for you. What actions can I take to lose weight? Nutrition   Make changes to your diet as told by your health care provider or diet and nutrition specialist (dietitian). This may include: ? Eating fewer calories. ? Eating more protein. ? Eating less unhealthy fats. ? Eating a diet that includes fresh fruits and vegetables, whole grains, low-fat dairy products, and lean protein. ? Avoiding foods with added  fat, salt, and sugar.  Drink plenty of water while you exercise to prevent dehydration or heat stroke. Activity  Choose an activity that you enjoy and set realistic goals. Your health care provider can help you make an exercise plan that works for you.  Exercise at a moderate or vigorous intensity most days of the week. ? The intensity of exercise may vary from person to person. You can tell how intense a  workout is for you by paying attention to your breathing and heartbeat. Most people will notice their breathing and heartbeat get faster with more intense exercise.  Do resistance training twice each week, such as: ? Push-ups. ? Sit-ups. ? Lifting weights. ? Using resistance bands.  Getting short amounts of exercise can be just as helpful as long structured periods of exercise. If you have trouble finding time to exercise, try to include exercise in your daily routine. ? Get up, stretch, and walk around every 30 minutes throughout the day. ? Go for a walk during your lunch break. ? Park your car farther away from your destination. ? If you take public transportation, get off one stop early and walk the rest of the way. ? Make phone calls while standing up and walking around. ? Take the stairs instead of elevators or escalators.  Wear comfortable clothes and shoes with good support.  Do not exercise so much that you hurt yourself, feel dizzy, or get very short of breath. Where to find more information  [place] Department of Health and Human Services: [employer]at  Centers for Disease Control and Prevention (CDC): http://www.wolf.info/ Contact a health care provider:  Before starting a new exercise program.  If you have questions or concerns about your weight.  If you have a medical problem that keeps you from exercising. Get help right away if you have any of the following while exercising:  Injury.  Dizziness.  Difficulty breathing or shortness of breath that does not go away when you stop exercising.  Chest pain.  Rapid heartbeat. Summary  Being overweight increases your risk of heart disease, stroke, diabetes, high blood pressure, and several types of cancer.  Losing weight happens when you burn more calories than you eat.  Reducing the amount of calories you eat in addition to getting regular moderate or vigorous exercise each week helps you lose weight. This information is not  intended to replace advice given to you by your health care provider. Make sure you discuss any questions you have with your health care provider. Document Released: 09/07/2010 Document Revised: 08/18/2017 Document Reviewed: 08/18/2017 Elsevier Interactive Patient Education  2019 [employer].

## 2019-02-14 NOTE — Progress Notes (Signed)
Subjective:     Patient ID: [name] [name] , female    DOB: [birth year] , 58 y.o.   MRN: [id number]   Chief Complaint  Patient presents with  . Hypertension    HPI  Hypertension This is a chronic problem. The current episode started more than 1 year ago. The problem has been gradually improving since onset. Pertinent negatives include no blurred vision, chest pain, headaches or palpitations. Risk factors for coronary artery disease include obesity, post-menopausal state and sedentary lifestyle. Past treatments include ACE inhibitors and diuretics. The current treatment provides moderate improvement. Compliance problems include exercise.      Past Medical History:  Diagnosis Date  . Abnormal glucose   . Candidiasis   . Cervicalgia   . Essential hypertension 12/07/2016  . Flatulence   . Hypertension   . Localized swelling, mass and lump, head   . Lumbago   . Morbid obesity ([place]) 12/07/2016  . Obesity 12/07/2016  . Onychomycosis      Family History  Problem Relation Age of Onset  . Hypertension Mother   . Heart failure Father   . Diabetes Father   . Heart attack Father   . Breast cancer Neg Hx      Current Outpatient Medications:  .  amLODipine (NORVASC) 5 MG tablet, Take 1 tablet (5 mg total) by mouth daily., Disp: 90 tablet, Rfl: 3 .  carvedilol (COREG) 12.5 MG tablet, Take 1 tablet (12.5 mg total) by mouth 2 (two) times daily., Disp: 180 tablet, Rfl: 1 .  Cholecalciferol (VITAMIN D3) 3000 units TABS, Take 3,000 Units by mouth daily. , Disp: , Rfl:  .  escitalopram (LEXAPRO) 10 MG tablet, Take 1 tablet (10 mg total) by mouth daily. Further refills from primary card doctor, Disp: 90 tablet, Rfl: 1 .  GLUCOSA-CHONDR-NA CHONDR-MSM PO, Take 1 capsule by mouth daily. 1500/1103 mg, Disp: , Rfl:  .  LORazepam (ATIVAN) 0.5 MG tablet, Take 1 tablet (0.5 mg total) by mouth every 8 (eight) hours as needed for anxiety., Disp: 30 tablet, Rfl: 0 .  meloxicam (MOBIC) 7.5 MG tablet, Take 1  tablet (7.5 mg total) by mouth daily., Disp: 15 tablet, Rfl: 0 .  Multiple Vitamins-Minerals (CENTRUM SILVER PO), Take 1 tablet by mouth daily., Disp: , Rfl:  .  Probiotic Product (PROBIOTIC DAILY PO), Take 1 tablet by mouth daily., Disp: , Rfl:  .  chlorhexidine (PERIDEX) 0.12 % solution, Use as directed 5 mLs in the mouth or throat 2 (two) times daily., Disp: 120 mL, Rfl: 1 .  cyclobenzaprine (FLEXERIL) 5 MG tablet, Take 1 tablet (5 mg total) by mouth at bedtime as needed for muscle spasms., Disp: 30 tablet, Rfl: 1 .  olmesartan-hydrochlorothiazide (BENICAR HCT) 20-12.5 MG tablet, Take 1 tablet by mouth daily., Disp: 30 tablet, Rfl: 1   No Known Allergies   Review of Systems  Constitutional: Negative.   Eyes: Negative for blurred vision.  Respiratory: Negative.   Cardiovascular: Negative.  Negative for chest pain and palpitations.  Gastrointestinal: Negative.   Musculoskeletal: Positive for back pain.       Has chronic back pain. Needs refill of cyclobenzaprine.   Neurological: Negative.  Negative for headaches.  Psychiatric/Behavioral: Negative.      Today's Vitals   02/10/19 1505  BP: 116/82  Pulse: 74  Temp: 98.8 F (37.1 C)  TempSrc: Oral  Weight: 252 lb (114.3 kg)  Height: 5\' 7"  (1.702 m)   Body mass index is 39.47 kg/m.   Objective:  Physical  Exam Vitals signs and nursing note reviewed.  Constitutional:      Appearance: Normal appearance.  HENT:     Head: Normocephalic and atraumatic.  Cardiovascular:     Rate and Rhythm: Normal rate and regular rhythm.     Heart sounds: Normal heart sounds.  Pulmonary:     Effort: Pulmonary effort is normal.     Breath sounds: Normal breath sounds.  Skin:    General: Skin is warm.  Neurological:     General: No focal deficit present.     Mental Status: She is alert.  Psychiatric:        Mood and Affect: Mood normal.        Behavior: Behavior normal.         Assessment And Plan:     1. Essential  hypertension  Well controlled. However, due to cough I will d/c lisinopril. I will switch her to olmesartan/hctz 20/12.5mg  once daily. She will rto in 4-6 weeks for re-evaluation. Importance of regular exercise was discussed with the patient.   2. Cough  Likely due to previous use of lisinopril, accompanied by postnasal drip. She is advised to use otc loratadine or zyrtec nightly to help decrease symptoms. She also plans to go to [facility] for COVID testing.   3. Chronic low back pain without sciatica, unspecified back pain laterality  Chronic. She was given rx cyclobenzaprine refill to use prn. Importance of staying active was discussed with the patient. She is encouraged to stretch regularly.   4. Class 2 severe obesity due to excess calories with serious comorbidity and body mass index (BMI) of 39.0 to 39.9 in adult [facility])  Importance of achieving optimal weight to decrease risk of cardiovascular disease and cancers was discussed with the patient in full detail. She is encouraged to start slowly - start with 10 minutes twice daily at least three to four days per week and to gradually build to 30 minutes five days weekly. She was given tips to incorporate more activity into her daily routine - take stairs when possible, park farther away from her job, grocery stores, etc.    Maximino Greenland, MD    THE PATIENT IS ENCOURAGED TO PRACTICE SOCIAL DISTANCING DUE TO THE COVID-19 PANDEMIC.

## 2019-02-23 ENCOUNTER — Other Ambulatory Visit: Payer: Self-pay | Admitting: *Deleted

## 2019-02-23 DIAGNOSIS — Z20822 Contact with and (suspected) exposure to covid-19: Secondary | ICD-10-CM

## 2019-03-01 ENCOUNTER — Other Ambulatory Visit: Payer: Self-pay

## 2019-03-01 ENCOUNTER — Other Ambulatory Visit: Payer: Self-pay | Admitting: Cardiovascular Disease

## 2019-03-01 ENCOUNTER — Telehealth: Payer: Self-pay

## 2019-03-01 LAB — NOVEL CORONAVIRUS, NAA: SARS-CoV-2, NAA: NOT DETECTED

## 2019-03-01 NOTE — Telephone Encounter (Signed)
I returned the pt's call and left her a message that the office was calling her back because the pt said she needed a refill on her carvedilol but that it's already been filled from another provider. ( Dr Skeet Latch ) and in the future if the pt could call her pharmacy for a refill request.

## 2019-03-24 ENCOUNTER — Other Ambulatory Visit: Payer: Self-pay

## 2019-03-24 ENCOUNTER — Ambulatory Visit (INDEPENDENT_AMBULATORY_CARE_PROVIDER_SITE_OTHER): Payer: BC Managed Care – PPO | Admitting: Internal Medicine

## 2019-03-24 ENCOUNTER — Encounter: Payer: Self-pay | Admitting: Internal Medicine

## 2019-03-24 VITALS — BP 110/86 | HR 74 | Temp 98.0°F | Ht 67.0 in | Wt 250.4 lb

## 2019-03-24 DIAGNOSIS — Z1231 Encounter for screening mammogram for malignant neoplasm of breast: Secondary | ICD-10-CM | POA: Diagnosis not present

## 2019-03-24 DIAGNOSIS — I10 Essential (primary) hypertension: Secondary | ICD-10-CM | POA: Diagnosis not present

## 2019-03-24 DIAGNOSIS — Z79899 Other long term (current) drug therapy: Secondary | ICD-10-CM

## 2019-03-24 MED ORDER — OLMESARTAN MEDOXOMIL-HCTZ 20-12.5 MG PO TABS: 1.0000 | ORAL_TABLET | Freq: Every day | ORAL | 1 refills | Status: DC

## 2019-03-24 NOTE — Patient Instructions (Signed)
Managing Your Hypertension Hypertension is commonly called high blood pressure. This is when the force of your blood pressing against the walls of your arteries is too strong. Arteries are blood vessels that carry blood from your heart throughout your body. Hypertension forces the heart to work harder to pump blood, and may cause the arteries to become narrow or stiff. Having untreated or uncontrolled hypertension can cause heart attack, stroke, kidney disease, and other problems. What are blood pressure readings? A blood pressure reading consists of a higher number over a lower number. Ideally, your blood pressure should be below 120/80. The first ("top") number is called the systolic pressure. It is a measure of the pressure in your arteries as your heart beats. The second ("bottom") number is called the diastolic pressure. It is a measure of the pressure in your arteries as the heart relaxes. What does my blood pressure reading mean? Blood pressure is classified into four stages. Based on your blood pressure reading, your health care provider may use the following stages to determine what type of treatment you need, if any. Systolic pressure and diastolic pressure are measured in a unit called mm Hg. Normal  Systolic pressure: below 120.  Diastolic pressure: below 80. Elevated  Systolic pressure: 120-129.  Diastolic pressure: below 80. Hypertension stage 1  Systolic pressure: 130-139.  Diastolic pressure: 80-89. Hypertension stage 2  Systolic pressure: 140 or above.  Diastolic pressure: 90 or above. What health risks are associated with hypertension? Managing your hypertension is an important responsibility. Uncontrolled hypertension can lead to:  A heart attack.  A stroke.  A weakened blood vessel (aneurysm).  Heart failure.  Kidney damage.  Eye damage.  Metabolic syndrome.  Memory and concentration problems. What changes can I make to manage my  hypertension? Hypertension can be managed by making lifestyle changes and possibly by taking medicines. Your health care provider will help you make a plan to bring your blood pressure within a normal range. Eating and drinking   Eat a diet that is high in fiber and potassium, and low in salt (sodium), added sugar, and fat. An example eating plan is called the DASH (Dietary Approaches to Stop Hypertension) diet. To eat this way: ? Eat plenty of fresh fruits and vegetables. Try to fill half of your plate at each meal with fruits and vegetables. ? Eat whole grains, such as whole wheat pasta, brown rice, or whole grain bread. Fill about one quarter of your plate with whole grains. ? Eat low-fat diary products. ? Avoid fatty cuts of meat, processed or cured meats, and poultry with skin. Fill about one quarter of your plate with lean proteins such as fish, chicken without skin, beans, eggs, and tofu. ? Avoid premade and processed foods. These tend to be higher in sodium, added sugar, and fat.  Reduce your daily sodium intake. Most people with hypertension should eat less than 1,500 mg of sodium a day.  Limit alcohol intake to no more than 1 drink a day for nonpregnant women and 2 drinks a day for men. One drink equals 12 oz of beer, 5 oz of wine, or 1 oz of hard liquor. Lifestyle  Work with your health care provider to maintain a healthy body weight, or to lose weight. Ask what an ideal weight is for you.  Get at least 30 minutes of exercise that causes your heart to beat faster (aerobic exercise) most days of the week. Activities may include walking, swimming, or biking.  Include exercise   to strengthen your muscles (resistance exercise), such as weight lifting, as part of your weekly exercise routine. Try to do these types of exercises for 30 minutes at least 3 days a week.  Do not use any products that contain nicotine or tobacco, such as cigarettes and e-cigarettes. If you need help quitting,  ask your health care provider.  Control any long-term (chronic) conditions you have, such as high cholesterol or diabetes. Monitoring  Monitor your blood pressure at home as told by your health care provider. Your personal target blood pressure may vary depending on your medical conditions, your age, and other factors.  Have your blood pressure checked regularly, as often as told by your health care provider. Working with your health care provider  Review all the medicines you take with your health care provider because there may be side effects or interactions.  Talk with your health care provider about your diet, exercise habits, and other lifestyle factors that may be contributing to hypertension.  Visit your health care provider regularly. Your health care provider can help you create and adjust your plan for managing hypertension. Will I need medicine to control my blood pressure? Your health care provider may prescribe medicine if lifestyle changes are not enough to get your blood pressure under control, and if:  Your systolic blood pressure is 130 or higher.  Your diastolic blood pressure is 80 or higher. Take medicines only as told by your health care provider. Follow the directions carefully. Blood pressure medicines must be taken as prescribed. The medicine does not work as well when you skip doses. Skipping doses also puts you at risk for problems. Contact a health care provider if:  You think you are having a reaction to medicines you have taken.  You have repeated (recurrent) headaches.  You feel dizzy.  You have swelling in your ankles.  You have trouble with your vision. Get help right away if:  You develop a severe headache or confusion.  You have unusual weakness or numbness, or you feel faint.  You have severe pain in your chest or abdomen.  You vomit repeatedly.  You have trouble breathing. Summary  Hypertension is when the force of blood pumping  through your arteries is too strong. If this condition is not controlled, it may put you at risk for serious complications.  Your personal target blood pressure may vary depending on your medical conditions, your age, and other factors. For most people, a normal blood pressure is less than 120/80.  Hypertension is managed by lifestyle changes, medicines, or both. Lifestyle changes include weight loss, eating a healthy, low-sodium diet, exercising more, and limiting alcohol. This information is not intended to replace advice given to you by your health care provider. Make sure you discuss any questions you have with your health care provider. Document Released: 04/29/2012 Document Revised: 11/27/2018 Document Reviewed: 07/03/2016 Elsevier Patient Education  2020 [employer]  

## 2019-03-24 NOTE — Progress Notes (Signed)
Subjective:     Patient ID: [name] [name] , female    DOB: 03-31-1961 , 58 y.o.   MRN: [id number]   Chief Complaint  Patient presents with  . Hypertension    HPI  She is here today for a bp check. She was started on olmesartan 20/12.53m at her last visit. She was taken off of the lisinopril due to the cough. She reports that her cough has lessened since stopping lisinopril. She has not had any issues with this medication.     Past Medical History:  Diagnosis Date  . Abnormal glucose   . Candidiasis   . Cervicalgia   . Essential hypertension 12/07/2016  . Flatulence   . Hypertension   . Localized swelling, mass and lump, head   . Lumbago   . [facility]  . Obesity 12/07/2016  . Onychomycosis      Family History  Problem Relation Age of Onset  . Hypertension Mother   . Heart failure Father   . Diabetes Father   . Heart attack Father   . Breast cancer Neg Hx      Current Outpatient Medications:  .  amLODipine (NORVASC) 5 MG tablet, Take 1 tablet (5 mg total) by mouth daily., Disp: 90 tablet, Rfl: 3 .  carvedilol (COREG) 12.5 MG tablet, Take 1 tablet by mouth twice daily, Disp: 180 tablet, Rfl: 0 .  chlorhexidine (PERIDEX) 0.12 % solution, Use as directed 5 mLs in the mouth or throat 2 (two) times daily., Disp: 120 mL, Rfl: 1 .  Cholecalciferol (VITAMIN D3) 3000 units TABS, Take 3,000 Units by mouth daily. , Disp: , Rfl:  .  cyclobenzaprine (FLEXERIL) 5 MG tablet, Take 1 tablet (5 mg total) by mouth at bedtime as needed for muscle spasms., Disp: 30 tablet, Rfl: 1 .  escitalopram (LEXAPRO) 10 MG tablet, Take 1 tablet (10 mg total) by mouth daily. Further refills from primary card doctor, Disp: 90 tablet, Rfl: 1 .  GLUCOSA-CHONDR-NA CHONDR-MSM PO, Take 1 capsule by mouth daily. 1500/1103 mg, Disp: , Rfl:  .  LORazepam (ATIVAN) 0.5 MG tablet, Take 1 tablet (0.5 mg total) by mouth every 8 (eight) hours as needed for anxiety., Disp: 30 tablet, Rfl: 0 .   olmesartan-hydrochlorothiazide (BENICAR HCT) 20-12.5 MG tablet, Take 1 tablet by mouth daily., Disp: 90 tablet, Rfl: 1 .  Probiotic Product (PROBIOTIC DAILY PO), Take 1 tablet by mouth daily., Disp: , Rfl:  .  meloxicam (MOBIC) 7.5 MG tablet, Take 1 tablet (7.5 mg total) by mouth daily. (Patient not taking: Reported on 03/24/2019), Disp: 15 tablet, Rfl: 0 .  Multiple Vitamins-Minerals (CENTRUM SILVER PO), Take 1 tablet by mouth daily., Disp: , Rfl:    No Known Allergies   Review of Systems  Constitutional: Negative.   Respiratory: Negative.   Cardiovascular: Negative.   Gastrointestinal: Negative.   Neurological: Negative.   Psychiatric/Behavioral: Negative.      Today's Vitals   03/24/19 1504  BP: 110/86  Pulse: 74  Temp: 98 F (36.7 C)  TempSrc: Oral  Weight: 250 lb 6.4 oz (113.6 kg)  Height: _0  (1.702 m)   Body mass index is 39.22 kg/m.   Objective:  Physical Exam Vitals signs and nursing note reviewed.  Constitutional:      Appearance: Normal appearance.  HENT:     Head: Normocephalic and atraumatic.  Cardiovascular:     Rate and Rhythm: Normal rate and regular rhythm.     Heart sounds: Normal heart sounds.  Pulmonary:     Effort: Pulmonary effort is normal.     Breath sounds: Normal breath sounds.  Skin:    General: Skin is warm.  Neurological:     General: No focal deficit present.     Mental Status: She is alert.  Psychiatric:        Mood and Affect: Mood normal.        Behavior: Behavior normal.         Assessment And Plan:     1. Essential hypertension  Chronic. Now well controlled. I will send in 90-day rx for olmesartan/hctz 20/12.36m once daily. I will check a bmp today. She is encouraged to incorporate more exercise into her daily routine.   2. Drug therapy  - BMP8+EGFR  3. Breast cancer screening by mammogram  - MM Digital Screening        RMaximino Greenland MD    THE PATIENT IS ENCOURAGED TO PRACTICE SOCIAL DISTANCING DUE TO THE  COVID-19 PANDEMIC.

## 2019-03-25 LAB — BMP8+EGFR
BUN/Creatinine Ratio: 11 (ref 9–23)
BUN: 9 mg/dL (ref 6–24)
CO2: 27 mmol/L (ref 20–29)
Calcium: 9.7 mg/dL (ref 8.7–10.2)
Chloride: 98 mmol/L (ref 96–106)
Creatinine, Ser: 0.81 mg/dL (ref 0.57–1.00)
GFR calc Af Amer: 94 mL/min/{1.73_m2} (ref >59)
GFR calc non Af Amer: 81 mL/min/{1.73_m2} (ref >59)
Glucose: 85 mg/dL (ref 65–99)
Potassium: 4 mmol/L (ref 3.5–5.2)
Sodium: 140 mmol/L (ref 134–144)

## 2019-05-18 ENCOUNTER — Other Ambulatory Visit: Payer: Self-pay

## 2019-05-18 ENCOUNTER — Ambulatory Visit
Admission: RE | Admit: 2019-05-18 | Discharge: 2019-05-18 | Disposition: A | Discharge status: Home / Self Care | Payer: Self-pay | Source: Ambulatory Visit | Attending: Internal Medicine | Admitting: Internal Medicine

## 2019-05-18 DIAGNOSIS — Z1231 Encounter for screening mammogram for malignant neoplasm of breast: Secondary | ICD-10-CM | POA: Diagnosis not present

## 2019-06-07 ENCOUNTER — Other Ambulatory Visit: Payer: Self-pay

## 2019-06-07 ENCOUNTER — Encounter: Payer: Self-pay | Admitting: Nurse Practitioner

## 2019-06-07 ENCOUNTER — Ambulatory Visit (INDEPENDENT_AMBULATORY_CARE_PROVIDER_SITE_OTHER): Payer: BC Managed Care – PPO | Admitting: Nurse Practitioner

## 2019-06-07 VITALS — BP 122/84 | HR 72 | Temp 98.7°F | Ht 68.0 in | Wt 260.6 lb

## 2019-06-07 DIAGNOSIS — R609 Edema, unspecified: Secondary | ICD-10-CM | POA: Diagnosis not present

## 2019-06-07 DIAGNOSIS — Z6839 Body mass index (BMI) 39.0-39.9, adult: Secondary | ICD-10-CM

## 2019-06-07 MED ORDER — HYDROCHLOROTHIAZIDE 12.5 MG PO TABS: 12.5000 mg | ORAL_TABLET | Freq: Every day | ORAL | 0 refills | Status: DC

## 2019-06-07 NOTE — Patient Instructions (Signed)
Edema  Edema is when you have too much fluid in your body or under your skin. Edema may make your legs, feet, and ankles swell up. Swelling is also common in looser tissues, like around your eyes. This is a common condition. It gets more common as you get older. There are many possible causes of edema. Eating too much salt (sodium) and being on your feet or sitting for a long time can cause edema in your legs, feet, and ankles. Hot weather may make edema worse. Edema is usually painless. Your skin may look swollen or shiny. Follow these instructions at home:  Keep the swollen body part raised (elevated) above the level of your heart when you are sitting or lying down.  Do not sit still or stand for a long time.  Do not wear tight clothes. Do not wear garters on your upper legs.  Exercise your legs. This can help the swelling go down.  Wear elastic bandages or support stockings as told by your doctor.  Eat a low-salt (low-sodium) diet to reduce fluid as told by your doctor.  Depending on the cause of your swelling, you may need to limit how much fluid you drink (fluid restriction).  Take over-the-counter and prescription medicines only as told by your doctor. Contact a doctor if:  Treatment is not working.  You have heart, liver, or kidney disease and have symptoms of edema.  You have sudden and unexplained weight gain. Get help right away if:  You have shortness of breath or chest pain.  You cannot breathe when you lie down.  You have pain, redness, or warmth in the swollen areas.  You have heart, liver, or kidney disease and get edema all of a sudden.  You have a fever and your symptoms get worse all of a sudden. Summary  Edema is when you have too much fluid in your body or under your skin.  Edema may make your legs, feet, and ankles swell up. Swelling is also common in looser tissues, like around your eyes.  Raise (elevate) the swollen body part above the level of your  heart when you are sitting or lying down.  Follow your doctor's instructions about diet and how much fluid you can drink (fluid restriction). This information is not intended to replace advice given to you by your health care provider. Make sure you discuss any questions you have with your health care provider. Document Released: 01/22/2008 Document Revised: 08/08/2017 Document Reviewed: 08/23/2016 Elsevier Patient Education  2020 [employer].   Take Hydrochlorthiazide 12.5mg  for the next 3-5 days if not better return call to office.

## 2019-06-07 NOTE — Progress Notes (Signed)
Subjective:     Patient ID: [name] [name] , female    DOB: September 25, 1960 , 58 y.o.   MRN: [id number]   Chief Complaint  Patient presents with  . Leg Swelling    HPI  She is having swelling in both legs, knees and hands.  She has had swelling for the last 3 weeks.  Worse at the end of the day.  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  She is having bilateral knee pain as well, has had surgery to right knee for torn meniscus to right knee. She does not wear support socks while working.   Wt Readings from Last 3 Encounters: 06/07/19 : 260 lb 9.6 oz (118.2 kg) 03/24/19 : 250 lb 6.4 oz (113.6 kg) 02/10/19 : 252 lb (114.3 kg)   She does admit to eating more carbs since the Pandemic.        Past Medical History:  Diagnosis Date  . Abnormal glucose   . Candidiasis   . Cervicalgia   . Essential hypertension 12/07/2016  . Flatulence   . Hypertension   . Localized swelling, mass and lump, head   . Lumbago   . Morbid obesity ([place]) 12/07/2016  . Obesity 12/07/2016  . Onychomycosis      Family History  Problem Relation Age of Onset  . Hypertension Mother   . Heart failure Father   . Diabetes Father   . Heart attack Father   . Breast cancer Neg Hx      Current Outpatient Medications:  .  amLODipine (NORVASC) 5 MG tablet, Take 1 tablet (5 mg total) by mouth daily., Disp: 90 tablet, Rfl: 3 .  Ascorbic Acid (VITAMIN C) 100 MG tablet, Take 100 mg by mouth daily., Disp: , Rfl:  .  carvedilol (COREG) 12.5 MG tablet, Take 1 tablet by mouth twice daily, Disp: 180 tablet, Rfl: 0 .  chlorhexidine (PERIDEX) 0.12 % solution, Use as directed 5 mLs in the mouth or throat 2 (two) times daily., Disp: 120 mL, Rfl: 1 .  Cholecalciferol (VITAMIN D3) 3000 units TABS, Take 3,000 Units by mouth daily. , Disp: , Rfl:  .  cyclobenzaprine (FLEXERIL) 5 MG tablet, Take 1 tablet (5 mg total) by mouth at bedtime as needed for muscle spasms., Disp: 30 tablet, Rfl: 1 .  escitalopram (LEXAPRO) 10 MG  tablet, Take 1 tablet (10 mg total) by mouth daily. Further refills from primary card doctor, Disp: 90 tablet, Rfl: 1 .  GLUCOSA-CHONDR-NA CHONDR-MSM PO, Take 1 capsule by mouth daily. 1500/1103 mg, Disp: , Rfl:  .  LORazepam (ATIVAN) 0.5 MG tablet, Take 1 tablet (0.5 mg total) by mouth every 8 (eight) hours as needed for anxiety., Disp: 30 tablet, Rfl: 0 .  magnesium 30 MG tablet, Take 30 mg by mouth 2 (two) times daily., Disp: , Rfl:  .  meloxicam (MOBIC) 7.5 MG tablet, Take 1 tablet (7.5 mg total) by mouth daily., Disp: 15 tablet, Rfl: 0 .  olmesartan-hydrochlorothiazide (BENICAR HCT) 20-12.5 MG tablet, Take 1 tablet by mouth daily., Disp: 90 tablet, Rfl: 1 .  Probiotic Product (PROBIOTIC DAILY PO), Take 1 tablet by mouth daily., Disp: , Rfl:  .  Multiple Vitamins-Minerals (CENTRUM SILVER PO), Take 1 tablet by mouth daily., Disp: , Rfl:    No Known Allergies   Review of Systems  Constitutional: Negative.   Respiratory: Negative.  Negative for apnea.   Cardiovascular: Negative.  Negative for chest pain, palpitations and leg swelling.  Neurological: Negative.  Negative  for dizziness and headaches.  Psychiatric/Behavioral: Negative.  Negative for agitation and confusion.     Today's Vitals   06/07/19 1155  BP: 122/84  Pulse: 72  Temp: 98.7 F (37.1 C)  TempSrc: Oral  Weight: 260 lb 9.6 oz (118.2 kg)  Height: '5\' 8"'  (1.727 m)   Body mass index is 39.62 kg/m.   Objective:  Physical Exam Vitals signs reviewed.  Constitutional:      Appearance: Normal appearance.  Cardiovascular:     Rate and Rhythm: Normal rate and regular rhythm.     Pulses: Normal pulses.     Heart sounds: Normal heart sounds. No murmur.  Neurological:     Mental Status: She is alert.         Assessment And Plan:     1. Edema, unspecified type  Bilateral legs with 1+ pitting edema, firm to touch and tender  Will provide her with HCTZ 12.97m for 3-5 days to see if this improves, this is likely  related to an increase in her weight and eating more carbohydrates.    I will also check BNP levels  - Brain natriuretic peptide - BMP8+eGFR - hydrochlorothiazide (HYDRODIURIL) 12.5 MG tablet; Take 1 tablet (12.5 mg total) by mouth daily.  Dispense: 30 tablet; Refill: 0  2. Class 2 severe obesity due to excess calories with serious comorbidity and body mass index (BMI) of 39.0 to 39.9 in adult ([facility]  She has gained 10 lbs in the last 2 months  She would like to be referred to a weight management clinic to discuss the gastric sleeve[facility]  I have explained to her to increase her physical activity to at least 150 minutes per week.   JMinette Brine FNP    THE PATIENT IS ENCOURAGED TO PRACTICE SOCIAL DISTANCING DUE TO THE COVID-19 PANDEMIC.

## 2019-06-08 LAB — BMP8+EGFR
BUN/Creatinine Ratio: 16 (ref 9–23)
BUN: 15 mg/dL (ref 6–24)
CO2: 28 mmol/L (ref 20–29)
Calcium: 9.3 mg/dL (ref 8.7–10.2)
Chloride: 100 mmol/L (ref 96–106)
Creatinine, Ser: 0.92 mg/dL (ref 0.57–1.00)
GFR calc Af Amer: 80 mL/min/{1.73_m2} (ref >59)
GFR calc non Af Amer: 70 mL/min/{1.73_m2} (ref >59)
Glucose: 59 mg/dL — ABNORMAL LOW (ref 65–99)
Potassium: 3.8 mmol/L (ref 3.5–5.2)
Sodium: 141 mmol/L (ref 134–144)

## 2019-06-08 LAB — BRAIN NATRIURETIC PEPTIDE: BNP: 39.5 pg/mL (ref 0.0–100.0)

## 2019-06-15 ENCOUNTER — Other Ambulatory Visit: Payer: Self-pay | Admitting: Cardiovascular Disease

## 2019-06-30 ENCOUNTER — Other Ambulatory Visit: Payer: Self-pay | Admitting: Nurse Practitioner

## 2019-06-30 ENCOUNTER — Telehealth: Payer: Self-pay

## 2019-06-30 DIAGNOSIS — R609 Edema, unspecified: Secondary | ICD-10-CM

## 2019-06-30 NOTE — Telephone Encounter (Signed)
Patient called stating she is still having leg swelling and it is tender to the touch she also stated it is a little red on the side of her leg and the right leg is worse than the left. She is scared it Is a DVT.  I RETURNED PT CALL AND LEFT HER A V/M STATING JANECE PLACED A ORDER FOR HER TO HAVE A DOPPLER DONE TO RULE OUT DVT. YRL,RMA

## 2019-07-01 ENCOUNTER — Telehealth: Payer: Self-pay

## 2019-07-01 ENCOUNTER — Telehealth (HOSPITAL_COMMUNITY): Payer: Self-pay | Admitting: *Deleted

## 2019-07-01 ENCOUNTER — Ambulatory Visit (HOSPITAL_COMMUNITY)
Admission: RE | Admit: 2019-07-01 | Discharge: 2019-07-01 | Disposition: A | Discharge status: Home / Self Care | Payer: BC Managed Care – PPO | Source: Ambulatory Visit | Attending: Family | Admitting: Family

## 2019-07-01 ENCOUNTER — Other Ambulatory Visit: Payer: Self-pay

## 2019-07-01 ENCOUNTER — Other Ambulatory Visit: Payer: Self-pay | Admitting: Nurse Practitioner

## 2019-07-01 DIAGNOSIS — R609 Edema, unspecified: Secondary | ICD-10-CM | POA: Insufficient documentation

## 2019-07-01 NOTE — Telephone Encounter (Signed)
[facility] called with patient's results and she does not have a DVT. YRL,RMA

## 2019-07-01 NOTE — Telephone Encounter (Signed)
Patient called stating she received a call back from me stating that we placed a order for her to have a doppler to r/o DVT. She wanted to know wjat was the next step.  I RETURNED HER CALL AND ADVISED HER THAT SHE SHOULD BE RECEIVING ON CALL WITH AN APPOINTMENT DATE AND TIME. I ALSO PROVIDED HER WITH THEIR NUMBER SHE STATED SHE WAS OFF TODAY AND WANTED TO KNOW IF SHE COULD GET AN APPOINTMENT TODAY. YRL,RMA

## 2019-07-01 NOTE — Telephone Encounter (Signed)
The above patient or their representative was contacted and gave the following answers to these questions:         Do you have any of the following symptoms?    NO  Fever                    Cough                   Shortness of breath  Do  you have any of the following other symptoms?    muscle pain         vomiting,        diarrhea        rash         weakness        red eye        abdominal pain         bruising          bruising or bleeding              joint pain           severe headache    Have you been in contact with someone who was or has been sick in the past 2 weeks?  NO  Yes                 Unsure                         Unable to assess   Does the person that you were in contact with have any of the following symptoms?   Cough         shortness of breath           muscle pain         vomiting,            diarrhea            rash            weakness           fever            red eye           abdominal pain           bruising  or  bleeding                joint pain                severe headache                 COMMENTS OR ACTION PLAN FOR THIS PATIENT:   No

## 2019-07-13 ENCOUNTER — Other Ambulatory Visit: Payer: Self-pay | Admitting: Nurse Practitioner

## 2019-07-13 ENCOUNTER — Other Ambulatory Visit: Payer: Self-pay | Admitting: Internal Medicine

## 2019-07-13 ENCOUNTER — Other Ambulatory Visit: Payer: Self-pay | Admitting: Cardiovascular Disease

## 2019-07-13 DIAGNOSIS — R609 Edema, unspecified: Secondary | ICD-10-CM

## 2019-07-19 ENCOUNTER — Other Ambulatory Visit: Payer: Self-pay

## 2019-07-19 ENCOUNTER — Encounter: Payer: Self-pay | Admitting: Nurse Practitioner

## 2019-07-19 ENCOUNTER — Ambulatory Visit (INDEPENDENT_AMBULATORY_CARE_PROVIDER_SITE_OTHER): Payer: BC Managed Care – PPO | Admitting: Nurse Practitioner

## 2019-07-19 VITALS — BP 122/80 | HR 67 | Temp 98.3°F | Ht 67.2 in | Wt 264.6 lb

## 2019-07-19 DIAGNOSIS — Z20828 Contact with and (suspected) exposure to other viral communicable diseases: Secondary | ICD-10-CM | POA: Diagnosis not present

## 2019-07-19 DIAGNOSIS — M79604 Pain in right leg: Secondary | ICD-10-CM | POA: Diagnosis not present

## 2019-07-19 DIAGNOSIS — Z139 Encounter for screening, unspecified: Secondary | ICD-10-CM

## 2019-07-19 DIAGNOSIS — R609 Edema, unspecified: Secondary | ICD-10-CM | POA: Diagnosis not present

## 2019-07-19 DIAGNOSIS — Z8249 Family history of ischemic heart disease and other diseases of the circulatory system: Secondary | ICD-10-CM

## 2019-07-19 NOTE — Progress Notes (Signed)
Subjective:     Patient ID: [name] [name] , female    DOB: [birth year] , 58 y.o.   MRN: [id number]   Chief Complaint  Patient presents with  . Edema    patient presents today for a med check on hydrochlorothiazide    HPI  She continues to have swelling to lower legs  She has two meniscus tears to her right knee and had surgery.  She reports a family history of arterial disease with her grandfather who had lower extremity amputations    Past Medical History:  Diagnosis Date  . Abnormal glucose   . Candidiasis   . Cervicalgia   . Essential hypertension 12/07/2016  . Flatulence   . Hypertension   . Localized swelling, mass and lump, head   . Lumbago   . Morbid obesity ([place]) 12/07/2016  . Obesity 12/07/2016  . Onychomycosis      Family History  Problem Relation Age of Onset  . Hypertension Mother   . Heart failure Father   . Diabetes Father   . Heart attack Father   . Breast cancer Neg Hx      Current Outpatient Medications:  .  amLODipine (NORVASC) 5 MG tablet, Take 1 tablet by mouth once daily, Disp: 90 tablet, Rfl: 1 .  Ascorbic Acid (VITAMIN C) 100 MG tablet, Take 100 mg by mouth daily., Disp: , Rfl:  .  carvedilol (COREG) 12.5 MG tablet, Take 1 tablet by mouth twice daily, Disp: 180 tablet, Rfl: 0 .  chlorhexidine (PERIDEX) 0.12 % solution, USE 5ML IN MOUTH OR THROAT TWICE DAILY AS DIRECTED, Disp: 473 mL, Rfl: 0 .  Cholecalciferol (VITAMIN D3) 3000 units TABS, Take 3,000 Units by mouth daily. , Disp: , Rfl:  .  escitalopram (LEXAPRO) 10 MG tablet, Take 1 tablet (10 mg total) by mouth daily. Further refills from primary card doctor, Disp: 90 tablet, Rfl: 1 .  GLUCOSA-CHONDR-NA CHONDR-MSM PO, Take 1 capsule by mouth daily. 1500/1103 mg, Disp: , Rfl:  .  hydrochlorothiazide (HYDRODIURIL) 12.5 MG tablet, Take 1 tablet by mouth once daily, Disp: 30 tablet, Rfl: 0 .  magnesium 30 MG tablet, Take 30 mg by mouth 2 (two) times daily., Disp: , Rfl:  .  meloxicam (MOBIC) 7.5  MG tablet, Take 1 tablet (7.5 mg total) by mouth daily., Disp: 15 tablet, Rfl: 0 .  olmesartan-hydrochlorothiazide (BENICAR HCT) 20-12.5 MG tablet, Take 1 tablet by mouth daily., Disp: 90 tablet, Rfl: 1 .  Probiotic Product (PROBIOTIC DAILY PO), Take 1 tablet by mouth daily., Disp: , Rfl:  .  cyclobenzaprine (FLEXERIL) 5 MG tablet, Take 1 tablet (5 mg total) by mouth at bedtime as needed for muscle spasms. (Patient not taking: Reported on 07/19/2019), Disp: 30 tablet, Rfl: 1 .  LORazepam (ATIVAN) 0.5 MG tablet, Take 1 tablet (0.5 mg total) by mouth every 8 (eight) hours as needed for anxiety. (Patient not taking: Reported on 07/19/2019), Disp: 30 tablet, Rfl: 0 .  Multiple Vitamins-Minerals (CENTRUM SILVER PO), Take 1 tablet by mouth daily., Disp: , Rfl:    No Known Allergies   Review of Systems  Constitutional: Negative.   Respiratory: Negative.   Cardiovascular: Negative.  Negative for chest pain, palpitations and leg swelling.  Musculoskeletal:       Right lower extremity edema more than left  Neurological: Negative for dizziness and headaches.     Today's Vitals   07/19/19 1202  BP: 122/80  Pulse: 67  Temp: 98.3 F (36.8 C)  TempSrc: Oral  Weight: 264 lb 9.6 oz (120 kg)  Height: 5' 7.2" (1.707 m)  PainSc: 0-No pain   Body mass index is 41.2 kg/m.   Objective:  Physical Exam Constitutional:      Appearance: Normal appearance.  Cardiovascular:     Rate and Rhythm: Normal rate and regular rhythm.     Pulses: Normal pulses.     Heart sounds: Normal heart sounds. No murmur.  Pulmonary:     Effort: Pulmonary effort is normal.     Breath sounds: Normal breath sounds.  Musculoskeletal:     Right lower leg: Edema (firm edema, with erythema and darkened pigmentation) present.     Left lower leg: Edema present.  Neurological:     General: No focal deficit present.     Mental Status: She is alert and oriented to person, place, and time.         Assessment And Plan:      1. Edema, unspecified type  Slightly better edema to right lower extremity, Ultrasound negative for DVT - DOPPLER ARTERIAL LEG RIGHT; Future  2. Right leg pain  Erythema, firm swelling and discoloration to right lower extremity  Will send for arterial doppler  - DOPPLER ARTERIAL LEG RIGHT; Future  3. Encounter for screening  She would like to be tested for coronavirus, denies any symptoms or known exposure.   - Novel Coronavirus, NAA ([facility])   Minette Brine, FNP    THE PATIENT IS ENCOURAGED TO PRACTICE SOCIAL DISTANCING DUE TO THE COVID-19 PANDEMIC.

## 2019-07-20 LAB — NOVEL CORONAVIRUS, NAA: SARS-CoV-2, NAA: NOT DETECTED

## 2019-08-11 ENCOUNTER — Other Ambulatory Visit: Payer: Self-pay | Admitting: Nurse Practitioner

## 2019-08-11 DIAGNOSIS — R609 Edema, unspecified: Secondary | ICD-10-CM

## 2019-08-23 ENCOUNTER — Other Ambulatory Visit: Payer: Self-pay | Admitting: Internal Medicine

## 2019-09-03 ENCOUNTER — Inpatient Hospital Stay: Payer: BC Managed Care – PPO

## 2019-09-03 ENCOUNTER — Inpatient Hospital Stay: Payer: BC Managed Care – PPO | Admitting: Oncology

## 2019-09-03 ENCOUNTER — Telehealth: Payer: Self-pay | Admitting: Oncology

## 2019-09-03 NOTE — Telephone Encounter (Signed)
Returned patient's phone call regarding cancelling 01/15 appointment, per patient's request appointment has been cancelled and patient when ready to reschedule.

## 2019-09-18 ENCOUNTER — Other Ambulatory Visit: Payer: Self-pay | Admitting: Nurse Practitioner

## 2019-09-18 ENCOUNTER — Other Ambulatory Visit: Payer: Self-pay | Admitting: Internal Medicine

## 2019-09-18 DIAGNOSIS — R609 Edema, unspecified: Secondary | ICD-10-CM

## 2019-10-01 ENCOUNTER — Other Ambulatory Visit: Payer: Self-pay | Admitting: Cardiovascular Disease

## 2019-10-05 ENCOUNTER — Ambulatory Visit (INDEPENDENT_AMBULATORY_CARE_PROVIDER_SITE_OTHER): Payer: BC Managed Care – PPO | Admitting: Internal Medicine

## 2019-10-05 ENCOUNTER — Encounter: Payer: Self-pay | Admitting: Internal Medicine

## 2019-10-05 ENCOUNTER — Other Ambulatory Visit: Payer: Self-pay

## 2019-10-05 VITALS — BP 124/76 | HR 60 | Temp 98.9°F | Ht 67.0 in | Wt 255.0 lb

## 2019-10-05 DIAGNOSIS — Z6839 Body mass index (BMI) 39.0-39.9, adult: Secondary | ICD-10-CM | POA: Diagnosis not present

## 2019-10-05 DIAGNOSIS — E559 Vitamin D deficiency, unspecified: Secondary | ICD-10-CM

## 2019-10-05 DIAGNOSIS — D472 Monoclonal gammopathy: Secondary | ICD-10-CM

## 2019-10-05 DIAGNOSIS — Z0000 Encounter for general adult medical examination without abnormal findings: Secondary | ICD-10-CM | POA: Diagnosis not present

## 2019-10-05 DIAGNOSIS — M25511 Pain in right shoulder: Secondary | ICD-10-CM

## 2019-10-05 DIAGNOSIS — I10 Essential (primary) hypertension: Secondary | ICD-10-CM

## 2019-10-05 LAB — POCT URINALYSIS DIPSTICK
Bilirubin, UA: NEGATIVE
Blood, UA: NEGATIVE
Glucose, UA: NEGATIVE
Ketones, UA: NEGATIVE
Leukocytes, UA: NEGATIVE
Nitrite, UA: NEGATIVE
Protein, UA: NEGATIVE
Spec Grav, UA: 1.025 (ref 1.010–1.025)
Urobilinogen, UA: 0.2 E.U./dL
pH, UA: 7 (ref 5.0–8.0)

## 2019-10-05 LAB — POCT UA - MICROALBUMIN
Albumin/Creatinine Ratio, Urine, POC: <30
Creatinine, POC: 200 mg/dL
Microalbumin Ur, POC: 10 mg/L

## 2019-10-05 MED ORDER — OLMESARTAN MEDOXOMIL-HCTZ 20-12.5 MG PO TABS: 1.0000 | ORAL_TABLET | Freq: Every day | ORAL | 1 refills | Status: DC

## 2019-10-05 MED ORDER — HYDROCHLOROTHIAZIDE 12.5 MG PO TABS: 12.5000 mg | ORAL_TABLET | Freq: Every day | ORAL | 1 refills | Status: DC

## 2019-10-05 MED ORDER — CARVEDILOL 12.5 MG PO TABS: 12.5000 mg | ORAL_TABLET | Freq: Two times a day (BID) | ORAL | 1 refills | Status: DC

## 2019-10-05 MED ORDER — AMLODIPINE BESYLATE 5 MG PO TABS: 5.0000 mg | ORAL_TABLET | Freq: Every day | ORAL | 1 refills | Status: DC

## 2019-10-05 NOTE — Patient Instructions (Signed)
Health Maintenance, Female Adopting a healthy lifestyle and getting preventive care are important in promoting health and wellness. Ask your health care provider about:  The right schedule for you to have regular tests and exams.  Things you can do on your own to prevent diseases and keep yourself healthy. What should I know about diet, weight, and exercise? Eat a healthy diet   Eat a diet that includes plenty of vegetables, fruits, low-fat dairy products, and lean protein.  Do not eat a lot of foods that are high in solid fats, added sugars, or sodium. Maintain a healthy weight Body mass index (BMI) is used to identify weight problems. It estimates body fat based on height and weight. Your health care provider can help determine your BMI and help you achieve or maintain a healthy weight. Get regular exercise Get regular exercise. This is one of the most important things you can do for your health. Most adults should:  Exercise for at least 150 minutes each week. The exercise should increase your heart rate and make you sweat (moderate-intensity exercise).  Do strengthening exercises at least twice a week. This is in addition to the moderate-intensity exercise.  Spend less time sitting. Even light physical activity can be beneficial. Watch cholesterol and blood lipids Have your blood tested for lipids and cholesterol at 57 years of age, then have this test every 5 years. Have your cholesterol levels checked more often if:  Your lipid or cholesterol levels are high.  You are older than 57 years of age.  You are at high risk for heart disease. What should I know about cancer screening? Depending on your health history and family history, you may need to have cancer screening at various ages. This may include screening for:  Breast cancer.  Cervical cancer.  Colorectal cancer.  Skin cancer.  Lung cancer. What should I know about heart disease, diabetes, and high blood  pressure? Blood pressure and heart disease  High blood pressure causes heart disease and increases the risk of stroke. This is more likely to develop in people who have high blood pressure readings, are of African descent, or are overweight.  Have your blood pressure checked: ? Every 3-5 years if you are 18-39 years of age. ? Every year if you are 40 years old or older. Diabetes Have regular diabetes screenings. This checks your fasting blood sugar level. Have the screening done:  Once every three years after age 40 if you are at a normal weight and have a low risk for diabetes.  More often and at a younger age if you are overweight or have a high risk for diabetes. What should I know about preventing infection? Hepatitis B If you have a higher risk for hepatitis B, you should be screened for this virus. Talk with your health care provider to find out if you are at risk for hepatitis B infection. Hepatitis C Testing is recommended for:  Everyone born from 1945 through 1965.  Anyone with known risk factors for hepatitis C. Sexually transmitted infections (STIs)  Get screened for STIs, including gonorrhea and chlamydia, if: ? You are sexually active and are younger than 57 years of age. ? You are older than 57 years of age and your health care provider tells you that you are at risk for this type of infection. ? Your sexual activity has changed since you were last screened, and you are at increased risk for chlamydia or gonorrhea. Ask your health care provider if   you are at risk.  Ask your health care provider about whether you are at high risk for HIV. Your health care provider may recommend a prescription medicine to help prevent HIV infection. If you choose to take medicine to prevent HIV, you should first get tested for HIV. You should then be tested every 3 months for as long as you are taking the medicine. Pregnancy  If you are about to stop having your period (premenopausal) and  you may become pregnant, seek counseling before you get pregnant.  Take 400 to 800 micrograms (mcg) of folic acid every day if you become pregnant.  Ask for birth control (contraception) if you want to prevent pregnancy. Osteoporosis and menopause Osteoporosis is a disease in which the bones lose minerals and strength with aging. This can result in bone fractures. If you are 65 years old or older, or if you are at risk for osteoporosis and fractures, ask your health care provider if you should:  Be screened for bone loss.  Take a calcium or vitamin D supplement to lower your risk of fractures.  Be given hormone replacement therapy (HRT) to treat symptoms of menopause. Follow these instructions at home: Lifestyle  Do not use any products that contain nicotine or tobacco, such as cigarettes, e-cigarettes, and chewing tobacco. If you need help quitting, ask your health care provider.  Do not use street drugs.  Do not share needles.  Ask your health care provider for help if you need support or information about quitting drugs. Alcohol use  Do not drink alcohol if: ? Your health care provider tells you not to drink. ? You are pregnant, may be pregnant, or are planning to become pregnant.  If you drink alcohol: ? Limit how much you use to 0-1 drink a day. ? Limit intake if you are breastfeeding.  Be aware of how much alcohol is in your drink. In the [place] one drink equals one 12 oz bottle of beer (355 mL), one 5 oz glass of wine (148 mL), or one 1 oz glass of hard liquor (44 mL). General instructions  Schedule regular health, dental, and eye exams.  Stay current with your vaccines.  Tell your health care provider if: ? You often feel depressed. ? You have ever been abused or do not feel safe at home. Summary  Adopting a healthy lifestyle and getting preventive care are important in promoting health and wellness.  Follow your health care provider's instructions about healthy  diet, exercising, and getting tested or screened for diseases.  Follow your health care provider's instructions on monitoring your cholesterol and blood pressure. This information is not intended to replace advice given to you by your health care provider. Make sure you discuss any questions you have with your health care provider. Document Revised: 07/29/2018 Document Reviewed: 07/29/2018 Elsevier Patient Education  2020 [employer]  

## 2019-10-05 NOTE — Progress Notes (Signed)
This visit occurred during the SARS-CoV-2 public health emergency.  Safety protocols were in place, including screening questions prior to the visit, additional usage of staff PPE, and extensive cleaning of exam room while observing appropriate contact time as indicated for disinfecting solutions.  Subjective:     Patient ID: [name] [name] , female    DOB: [birth year] , 59 y.o.   MRN: [id number]   Chief Complaint  Patient presents with  . Annual Exam  . Hypertension    HPI  She is here today for a full physical examination.  She has no specific concerns or complaints at this time.   Hypertension This is a chronic problem. The current episode started more than 1 year ago. The problem has been gradually improving since onset. The problem is controlled. Pertinent negatives include no blurred vision, chest pain, palpitations or shortness of breath. Risk factors for coronary artery disease include obesity, post-menopausal state and sedentary lifestyle. The current treatment provides moderate improvement. Compliance problems include exercise.      Past Medical History:  Diagnosis Date  . Abnormal glucose   . Candidiasis   . Cervicalgia   . Essential hypertension 12/07/2016  . Flatulence   . Hypertension   . Localized swelling, mass and lump, head   . Lumbago   . Morbid obesity ([place]) 12/07/2016  . Obesity 12/07/2016  . Onychomycosis      Family History  Problem Relation Age of Onset  . Hypertension Mother   . Heart failure Father   . Diabetes Father   . Heart attack Father   . Breast cancer Neg Hx      Current Outpatient Medications:  .  amLODipine (NORVASC) 5 MG tablet, Take 1 tablet (5 mg total) by mouth daily., Disp: 90 tablet, Rfl: 1 .  Ascorbic Acid (VITAMIN C) 100 MG tablet, Take 100 mg by mouth daily., Disp: , Rfl:  .  BIOTIN PO, Take by mouth., Disp: , Rfl:  .  carvedilol (COREG) 12.5 MG tablet, Take 1 tablet (12.5 mg total) by mouth 2 (two) times daily. Please  schedule annual appt with Dr. Oval Linsey for refills. [phone]. 1st attempt., Disp: 180 tablet, Rfl: 1 .  chlorhexidine (PERIDEX) 0.12 % solution, USE 5 ML IN MOUTH  OR THROAT TWICE DAILY AS DIRECTED, Disp: 473 mL, Rfl: 0 .  Cholecalciferol (VITAMIN D3) 3000 units TABS, Take 3,000 Units by mouth daily. , Disp: , Rfl:  .  ELDERBERRY PO, Take by mouth., Disp: , Rfl:  .  escitalopram (LEXAPRO) 10 MG tablet, TAKE 1 TABLET BY MOUTH ONCE DAILY. FURTHER REFILLS FROM PRIMARY CARE DR, Disp: 90 tablet, Rfl: 0 .  GLUCOSA-CHONDR-NA CHONDR-MSM PO, Take 1 capsule by mouth daily. 1500/1103 mg, Disp: , Rfl:  .  hydrochlorothiazide (HYDRODIURIL) 12.5 MG tablet, Take 1 tablet (12.5 mg total) by mouth daily., Disp: 90 tablet, Rfl: 1 .  magnesium 30 MG tablet, Take 30 mg by mouth 2 (two) times daily., Disp: , Rfl:  .  olmesartan-hydrochlorothiazide (BENICAR HCT) 20-12.5 MG tablet, Take 1 tablet by mouth daily., Disp: 90 tablet, Rfl: 1 .  Probiotic Product (PROBIOTIC DAILY PO), Take 1 tablet by mouth daily., Disp: , Rfl:  .  cyclobenzaprine (FLEXERIL) 5 MG tablet, Take 1 tablet (5 mg total) by mouth at bedtime as needed for muscle spasms. (Patient not taking: Reported on 10/05/2019), Disp: 30 tablet, Rfl: 1 .  LORazepam (ATIVAN) 0.5 MG tablet, Take 1 tablet (0.5 mg total) by mouth every 8 (eight) hours as needed for  anxiety. (Patient not taking: Reported on 07/19/2019), Disp: 30 tablet, Rfl: 0 .  meloxicam (MOBIC) 7.5 MG tablet, Take 1 tablet (7.5 mg total) by mouth daily. (Patient not taking: Reported on 10/05/2019), Disp: 15 tablet, Rfl: 0 .  Multiple Vitamins-Minerals (CENTRUM SILVER PO), Take 1 tablet by mouth daily., Disp: , Rfl:    No Known Allergies   Review of Systems  Constitutional: Negative.   HENT: Negative.   Eyes: Negative.  Negative for blurred vision.  Respiratory: Negative.  Negative for shortness of breath.   Cardiovascular: Negative.  Negative for chest pain and palpitations.  Endocrine:  Negative.   Genitourinary: Negative.   Musculoskeletal: Negative.        She c/o r shoulder pain.  She reports sx started about 2 months ago, didnt do anything in particular.  She denies fall/trauma.   Skin: Negative.   Allergic/Immunologic: Negative.   Neurological: Negative.   Hematological: Negative.   Psychiatric/Behavioral: Negative.      Today's Vitals   10/05/19 1039  BP: 124/76  Pulse: 60  Temp: 98.9 F (37.2 C)  TempSrc: Oral  Weight: 255 lb (115.7 kg)  Height: '5\' 7"'  (1.702 m)   Body mass index is 39.94 kg/m.   Objective:  Physical Exam Vitals and nursing note reviewed.  Constitutional:      Appearance: Normal appearance. She is obese.  HENT:     Head: Normocephalic and atraumatic.     Right Ear: Tympanic membrane, ear canal and external ear normal.     Left Ear: Tympanic membrane, ear canal and external ear normal.     Nose:     Comments: Deferred, masked    Mouth/Throat:     Comments: Deferred, masked Eyes:     Extraocular Movements: Extraocular movements intact.     Conjunctiva/sclera: Conjunctivae normal.     Pupils: Pupils are equal, round, and reactive to light.  Cardiovascular:     Rate and Rhythm: Normal rate and regular rhythm.     Pulses: Normal pulses.     Heart sounds: Normal heart sounds.  Pulmonary:     Effort: Pulmonary effort is normal.     Breath sounds: Normal breath sounds.  Chest:     Breasts: Tanner Score is 5.        Right: Normal.        Left: Normal.  Abdominal:     General: Abdomen is flat. Bowel sounds are normal.     Palpations: Abdomen is soft.  Genitourinary:    Comments: deferred Musculoskeletal:        General: Normal range of motion.     Right shoulder: Tenderness and bony tenderness present.     Cervical back: Normal range of motion and neck supple.  Skin:    General: Skin is warm and dry.  Neurological:     General: No focal deficit present.     Mental Status: She is alert and oriented to person, place, and  time.  Psychiatric:        Mood and Affect: Mood normal.        Behavior: Behavior normal.         Assessment And Plan:     1. Routine general medical examination at health care facility  A full exam was performed.  Importance of monthly self breast exams was discussed with the patient. PATIENT IS ADVISED TO GET 30-45 MINUTES REGULAR EXERCISE NO LESS THAN FOUR TO FIVE DAYS PER WEEK - BOTH WEIGHTBEARING EXERCISES AND AEROBIC ARE  RECOMMENDED.  SHE IS ADVISED TO FOLLOW A HEALTHY DIET WITH AT LEAST SIX FRUITS/VEGGIES PER DAY, DECREASE INTAKE OF RED MEAT, AND TO INCREASE FISH INTAKE TO TWO DAYS PER WEEK.  MEATS/FISH SHOULD NOT BE FRIED, BAKED OR BROILED IS PREFERABLE.  I SUGGEST WEARING SPF 50 SUNSCREEN ON EXPOSED PARTS AND ESPECIALLY WHEN IN THE DIRECT SUNLIGHT FOR AN EXTENDED PERIOD OF TIME.  PLEASE AVOID FAST FOOD RESTAURANTS AND INCREASE YOUR WATER INTAKE.  - CMP14+EGFR - CBC - Lipid panel - Hemoglobin A1c  2. Essential hypertension  Chronic, well controlled. She will continue with current meds. She is encouraged to avoid adding salt to her foods. EKG performed, no new changes noted - SB, no acute changes.   3. Monoclonal gammopathy of unknown significance (MGUS)  Chronic. She has not had recent Hem/Onc evaluation. She is agreeable to Hem/Onc referral.   - Ambulatory referral to Oncology  4. Acute pain of right shoulder  Pt advised to apply topical pain cream to affected area bid-tid prn. She will let me know if her sx persist. If so, I will refer her to Ortho at that time.   5. Vitamin D deficiency disease  I WILL CHECK A VIT D LEVEL AND SUPPLEMENT AS NEEDED.  ALSO ENCOURAGED TO SPEND 15 MINUTES IN THE SUN DAILY.  - Vitamin D (25 hydroxy)  6. Class 2 severe obesity due to excess calories with serious comorbidity and body mass index (BMI) of 39.0 to 39.9 in adult [facility])  She was congratulated on her 9 pound weight loss and encouraged to keep up the great work. She is encouraged  to strive for BMI less than 30 to decrease cardiac risk.    Wt Readings from Last 3 Encounters:  10/05/19 255 lb (115.7 kg)  07/19/19 264 lb 9.6 oz (120 kg)  06/07/19 260 lb 9.6 oz (118.2 kg)      Maximino Greenland, MD    THE PATIENT IS ENCOURAGED TO PRACTICE SOCIAL DISTANCING DUE TO THE COVID-19 PANDEMIC.

## 2019-10-06 LAB — CMP14+EGFR
ALT: 5 IU/L (ref 0–32)
AST: 13 IU/L (ref 0–40)
Albumin/Globulin Ratio: 1.4 (ref 1.2–2.2)
Albumin: 4.6 g/dL (ref 3.8–4.9)
Alkaline Phosphatase: 58 IU/L (ref 39–117)
BUN/Creatinine Ratio: 20 (ref 9–23)
BUN: 16 mg/dL (ref 6–24)
Bilirubin Total: 0.3 mg/dL (ref 0.0–1.2)
CO2: 28 mmol/L (ref 20–29)
Calcium: 9.6 mg/dL (ref 8.7–10.2)
Chloride: 99 mmol/L (ref 96–106)
Creatinine, Ser: 0.81 mg/dL (ref 0.57–1.00)
GFR calc Af Amer: 94 mL/min/{1.73_m2} (ref >59)
GFR calc non Af Amer: 81 mL/min/{1.73_m2} (ref >59)
Globulin, Total: 3.3 g/dL (ref 1.5–4.5)
Glucose: 74 mg/dL (ref 65–99)
Potassium: 4 mmol/L (ref 3.5–5.2)
Sodium: 141 mmol/L (ref 134–144)
Total Protein: 7.9 g/dL (ref 6.0–8.5)

## 2019-10-06 LAB — LIPID PANEL
Chol/HDL Ratio: 3.3 ratio (ref 0.0–4.4)
Cholesterol, Total: 196 mg/dL (ref 100–199)
HDL: 59 mg/dL (ref >39)
LDL Chol Calc (NIH): 126 mg/dL — ABNORMAL HIGH (ref 0–99)
Triglycerides: 62 mg/dL (ref 0–149)
VLDL Cholesterol Cal: 11 mg/dL (ref 5–40)

## 2019-10-06 LAB — HEMOGLOBIN A1C
Est. average glucose Bld gHb Est-mCnc: 120 mg/dL
Hgb A1c MFr Bld: 5.8 % — ABNORMAL HIGH (ref 4.8–5.6)

## 2019-10-06 LAB — CBC
Hematocrit: 38.4 % (ref 34.0–46.6)
Hemoglobin: 12.4 g/dL (ref 11.1–15.9)
MCH: 28.1 pg (ref 26.6–33.0)
MCHC: 32.3 g/dL (ref 31.5–35.7)
MCV: 87 fL (ref 79–97)
Platelets: 188 10*3/uL (ref 150–450)
RBC: 4.42 x10E6/uL (ref 3.77–5.28)
RDW: 14.6 % (ref 11.7–15.4)
WBC: 4.4 10*3/uL (ref 3.4–10.8)

## 2019-10-06 LAB — VITAMIN D 25 HYDROXY (VIT D DEFICIENCY, FRACTURES): Vit D, 25-Hydroxy: 62.3 ng/mL (ref 30.0–100.0)

## 2020-01-19 ENCOUNTER — Inpatient Hospital Stay (HOSPITAL_BASED_OUTPATIENT_CLINIC_OR_DEPARTMENT_OTHER): Payer: BC Managed Care – PPO | Admitting: Oncology

## 2020-01-19 ENCOUNTER — Inpatient Hospital Stay: Payer: BC Managed Care – PPO | Attending: Oncology

## 2020-01-19 ENCOUNTER — Other Ambulatory Visit: Payer: Self-pay

## 2020-01-19 VITALS — BP 141/81 | HR 52 | Temp 97.6°F | Resp 20 | Ht 67.0 in | Wt 252.8 lb

## 2020-01-19 DIAGNOSIS — D472 Monoclonal gammopathy: Secondary | ICD-10-CM

## 2020-01-19 DIAGNOSIS — Z79899 Other long term (current) drug therapy: Secondary | ICD-10-CM | POA: Diagnosis not present

## 2020-01-19 LAB — CBC WITH DIFFERENTIAL (CANCER CENTER ONLY)
Abs Immature Granulocytes: 0.01 10*3/uL (ref 0.00–0.07)
Basophils Absolute: 0 10*3/uL (ref 0.0–0.1)
Basophils Relative: 1 %
Eosinophils Absolute: 0.1 10*3/uL (ref 0.0–0.5)
Eosinophils Relative: 2 %
HCT: 36 % (ref 36.0–46.0)
Hemoglobin: 11.7 g/dL — ABNORMAL LOW (ref 12.0–15.0)
Immature Granulocytes: 0 %
Lymphocytes Relative: 34 %
Lymphs Abs: 2.1 10*3/uL (ref 0.7–4.0)
MCH: 28.7 pg (ref 26.0–34.0)
MCHC: 32.5 g/dL (ref 30.0–36.0)
MCV: 88.5 fL (ref 80.0–100.0)
Monocytes Absolute: 0.5 10*3/uL (ref 0.1–1.0)
Monocytes Relative: 8 %
Neutro Abs: 3.4 10*3/uL (ref 1.7–7.7)
Neutrophils Relative %: 55 %
Platelet Count: 185 10*3/uL (ref 150–400)
RBC: 4.07 MIL/uL (ref 3.87–5.11)
RDW: 15.9 % — ABNORMAL HIGH (ref 11.5–15.5)
WBC Count: 6.2 10*3/uL (ref 4.0–10.5)
nRBC: 0 % (ref 0.0–0.2)

## 2020-01-19 LAB — CMP (CANCER CENTER ONLY)
ALT: <6 U/L (ref 0–44)
AST: 15 U/L (ref 15–41)
Albumin: 4.1 g/dL (ref 3.5–5.0)
Alkaline Phosphatase: 57 U/L (ref 38–126)
Anion gap: 10 (ref 5–15)
BUN: 16 mg/dL (ref 6–20)
CO2: 29 mmol/L (ref 22–32)
Calcium: 9.3 mg/dL (ref 8.9–10.3)
Chloride: 103 mmol/L (ref 98–111)
Creatinine: 0.86 mg/dL (ref 0.44–1.00)
GFR, Est AFR Am: >60 mL/min (ref >60)
GFR, Estimated: >60 mL/min (ref >60)
Glucose, Bld: 79 mg/dL (ref 70–99)
Potassium: 3.7 mmol/L (ref 3.5–5.1)
Sodium: 142 mmol/L (ref 135–145)
Total Bilirubin: 0.5 mg/dL (ref 0.3–1.2)
Total Protein: 8 g/dL (ref 6.5–8.1)

## 2020-01-19 NOTE — Progress Notes (Signed)
Hematology and Oncology Follow Up Visit  [name] [name] [id number] Jan 07, 1961 59 y.o. 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  She was found to have M spike of 1 g/dL and IgG level of 1931.   Current therapy: Active surveillance.  Interim History: Ms. [name] [name] is here for a follow-up visit.  Since her last visit, she reports no major changes in her health.  She denies any recent hospitalizations or illnesses.  He denies any bone pain pathological fractures.  She denies any peripheral neuropathy or worsening fatigue.  Medications: Updated on review. Current Outpatient Medications  Medication Sig Dispense Refill  . amLODipine (NORVASC) 5 MG tablet Take 1 tablet (5 mg total) by mouth daily. 90 tablet 1  . Ascorbic Acid (VITAMIN C) 100 MG tablet Take 100 mg by mouth daily.    Marland Kitchen BIOTIN PO Take by mouth.    . carvedilol (COREG) 12.5 MG tablet Take 1 tablet (12.5 mg total) by mouth 2 (two) times daily. Please schedule annual appt with Dr. Oval Linsey for refills. [phone]. 1st attempt. 180 tablet 1  . chlorhexidine (PERIDEX) 0.12 % solution USE 5 ML IN MOUTH  OR THROAT TWICE DAILY AS DIRECTED 473 mL 0  . Cholecalciferol (VITAMIN D3) 3000 units TABS Take 3,000 Units by mouth daily.     . cyclobenzaprine (FLEXERIL) 5 MG tablet Take 1 tablet (5 mg total) by mouth at bedtime as needed for muscle spasms. (Patient not taking: Reported on 10/05/2019) 30 tablet 1  . ELDERBERRY PO Take by mouth.    . escitalopram (LEXAPRO) 10 MG tablet TAKE 1 TABLET BY MOUTH ONCE DAILY. FURTHER REFILLS FROM PRIMARY CARE DR 90 tablet 0  . GLUCOSA-CHONDR-NA CHONDR-MSM PO Take 1 capsule by mouth daily. 1500/1103 mg    . hydrochlorothiazide (HYDRODIURIL) 12.5 MG tablet Take 1 tablet (12.5 mg total) by mouth daily. 90 tablet 1  . LORazepam (ATIVAN) 0.5 MG tablet Take 1 tablet (0.5 mg total) by mouth every 8 (eight) hours as needed for  anxiety. (Patient not taking: Reported on 07/19/2019) 30 tablet 0  . magnesium 30 MG tablet Take 30 mg by mouth 2 (two) times daily.    . meloxicam (MOBIC) 7.5 MG tablet Take 1 tablet (7.5 mg total) by mouth daily. (Patient not taking: Reported on 10/05/2019) 15 tablet 0  . Multiple Vitamins-Minerals (CENTRUM SILVER PO) Take 1 tablet by mouth daily.    Marland Kitchen olmesartan-hydrochlorothiazide (BENICAR HCT) 20-12.5 MG tablet Take 1 tablet by mouth daily. 90 tablet 1  . Probiotic Product (PROBIOTIC DAILY PO) Take 1 tablet by mouth daily.     No current facility-administered medications for this visit.     Allergies: No Known Allergies    . Physical Exam: Blood pressure (!) 141/81, pulse (!) 52, temperature 97.6 F (36.4 C), temperature source Temporal, resp. rate 20, height _0  (1.702 m), weight 252 lb 12.8 oz (114.7 kg), SpO2 99 %. ECOG: 0    General appearance: Alert, awake without any distress. Head: Atraumatic without abnormalities Oropharynx: Without any thrush or ulcers. Eyes: No scleral icterus. Lymph nodes: No lymphadenopathy noted in the cervical, supraclavicular, or axillary nodes Heart:regular rate and rhythm, without any murmurs or gallops.   Lung: Clear to auscultation without any rhonchi, wheezes or dullness to percussion. Abdomin: Soft, nontender without any shifting dullness or ascites. Musculoskeletal: No clubbing or cyanosis. Neurological: No motor or sensory deficits. Skin: No rashes or lesions.    Lab  Results: Lab Results  Component Value Date   WBC 6.2 01/19/2020   HGB 11.7 (L) 01/19/2020   HCT 36.0 01/19/2020   MCV 88.5 01/19/2020   PLT 185 01/19/2020     Chemistry      Component Value Date/Time   NA 141 10/05/2019 1146   NA 139 08/22/2017 1254   K 4.0 10/05/2019 1146   K 3.9 08/22/2017 1254   CL 99 10/05/2019 1146   CO2 28 10/05/2019 1146   CO2 31 (H) 08/22/2017 1254   BUN 16 10/05/2019 1146   BUN 15.3 08/22/2017 1254   CREATININE 0.81  10/05/2019 1146   CREATININE 0.89 09/04/2018 1222   CREATININE 0.9 08/22/2017 1254      Component Value Date/Time   CALCIUM 9.6 10/05/2019 1146   CALCIUM 9.3 08/22/2017 1254   ALKPHOS 58 10/05/2019 1146   ALKPHOS 52 08/22/2017 1254   AST 13 10/05/2019 1146   AST 12 (L) 09/04/2018 1222   AST 15 08/22/2017 1254   ALT 5 10/05/2019 1146   ALT <6 09/04/2018 1222   ALT 7 08/22/2017 1254   BILITOT 0.3 10/05/2019 1146   BILITOT 0.4 09/04/2018 1222   BILITOT 0.51 08/22/2017 1254       Results for [name], [name] (MRN [id number]) as of 01/19/2020 15:18  Ref. Range 08/22/2017 12:54 09/04/2018 12:22  M Protein SerPl Elph-Mcnc Latest Ref Range: Not Observed g/dL 1.0 (H) 1.1 (H)  IFE 1 Unknown Comment Comment  Globulin, Total Latest Ref Range: 2.2 - 3.9 g/dL 4.0 (H) 3.7  B-Globulin SerPl Elph-Mcnc Latest Ref Range: 0.7 - 1.3 g/dL 1.0 0.9  IgG (Immunoglobin G), Serum Latest Ref Range: 700 - 1,600 mg/dL 1,931 (H) 1,812 (H)  IgM (Immunoglobulin M), Srm Latest Ref Range: 26 - 217 mg/dL  80  IgM, Qn, Serum Latest Ref Range: 26 - 217 mg/dL 94   IgA Latest Ref Range: 87 - 352 mg/dL  98    Impression and Plan:  59 year old woman with the following issues:  1.  MGUS presented in 2018 with IgG kappa subtype and M spike of close to 1 g/dL.  She continues to be on active surveillance without any evidence to suggest endorgan damage or multiple myeloma.  Protein studies from January 2020 showed no changes in her M spike or IgG level.  The natural course of this disease and risk of progression into multiple myeloma was reviewed at this time.  Signs or symptoms of endorgan damage associated with multiple myeloma were discussed including anemia, worsening renal disease and bone involvement.  She has no signs or symptoms to suggest this at this time.  Laboratory data from today showed a CBC that is close to normal range at this time and not dramatically different from her baseline.  I recommended continued  active surveillance for the time being.  2.  Follow-up: She will return in 1 year for repeat evaluation.  30  minutes were dedicated to this visit. The time was spent on reviewing laboratory data, discussing treatment options, discussing differential diagnosis and answering questions regarding future plan.    Zola Button, MD 6/2/20213:16 PM

## 2020-01-20 ENCOUNTER — Telehealth: Payer: Self-pay | Admitting: Oncology

## 2020-01-20 NOTE — Telephone Encounter (Signed)
Scheduled appt per 6/2 los.  Sent a secure chat to get a calendar mailed out. 

## 2020-02-08 ENCOUNTER — Other Ambulatory Visit: Payer: Self-pay | Admitting: Nurse Practitioner

## 2020-02-08 DIAGNOSIS — Z6839 Body mass index (BMI) 39.0-39.9, adult: Secondary | ICD-10-CM

## 2020-02-19 IMAGING — MG MM DIGITAL SCREENING BILAT W/ CAD
4 series · 4 of 4 positions shown · non-contrast
Comparison: Previous exam(s).

CLINICAL DATA: Screening.

EXAM:
DIGITAL SCREENING BILATERAL MAMMOGRAM WITH CAD

[L MLO]
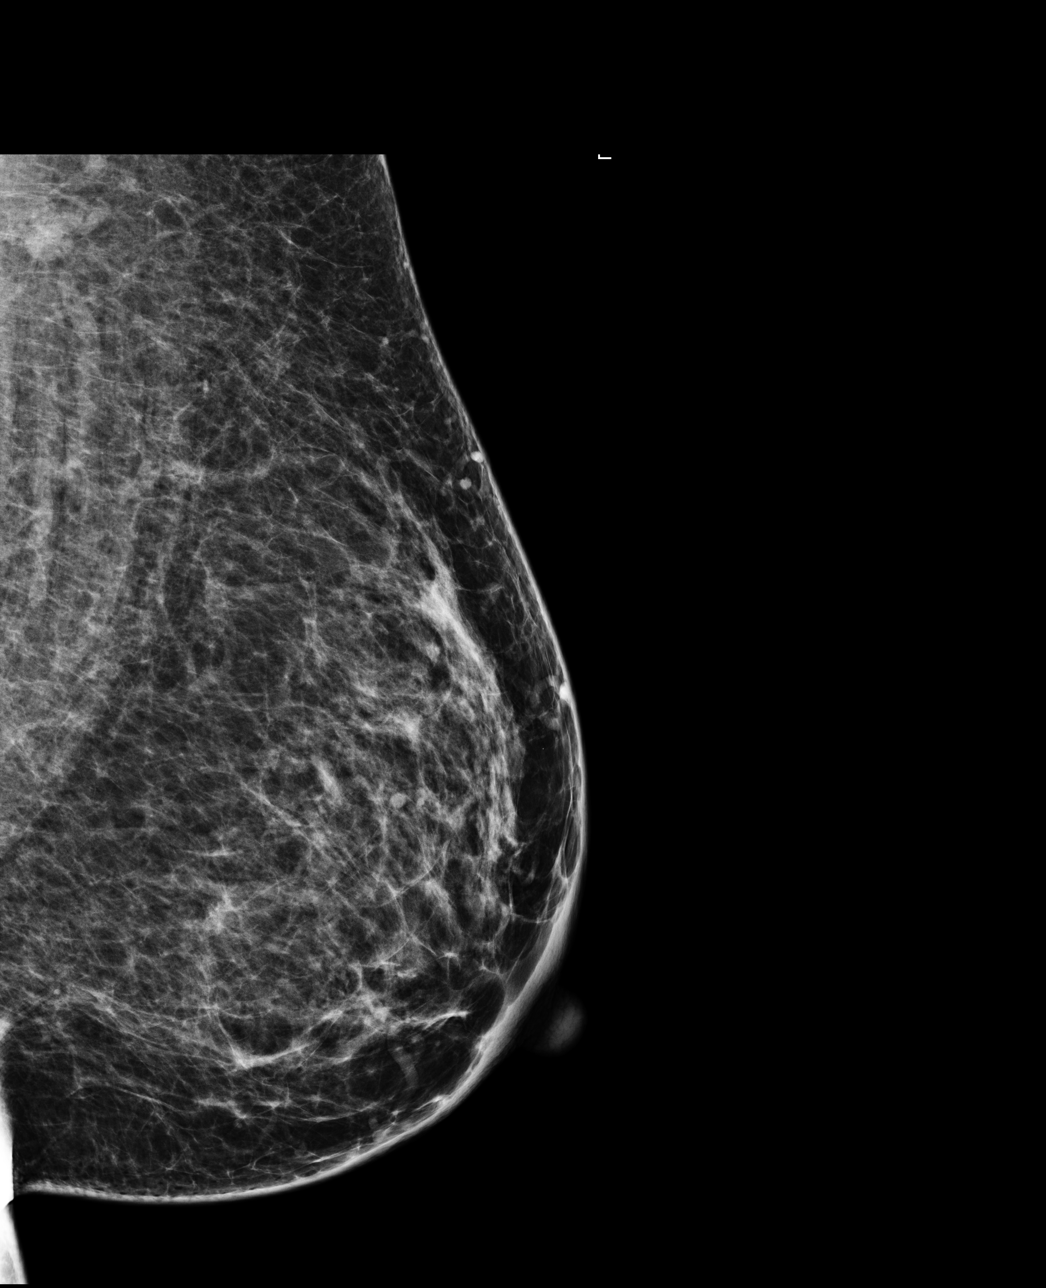

[R MLO]
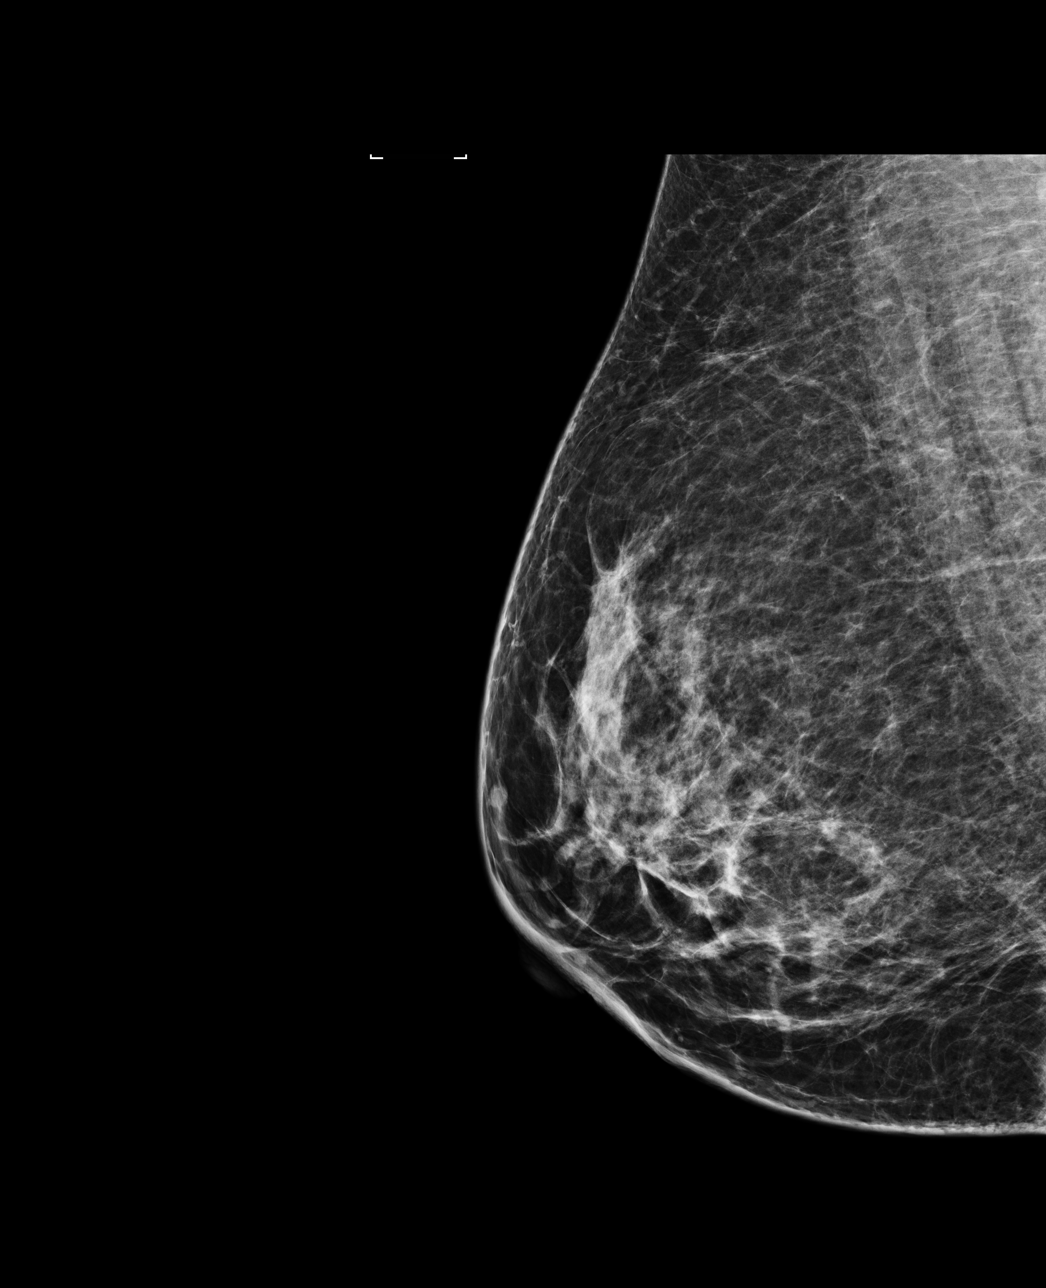

[R CC]
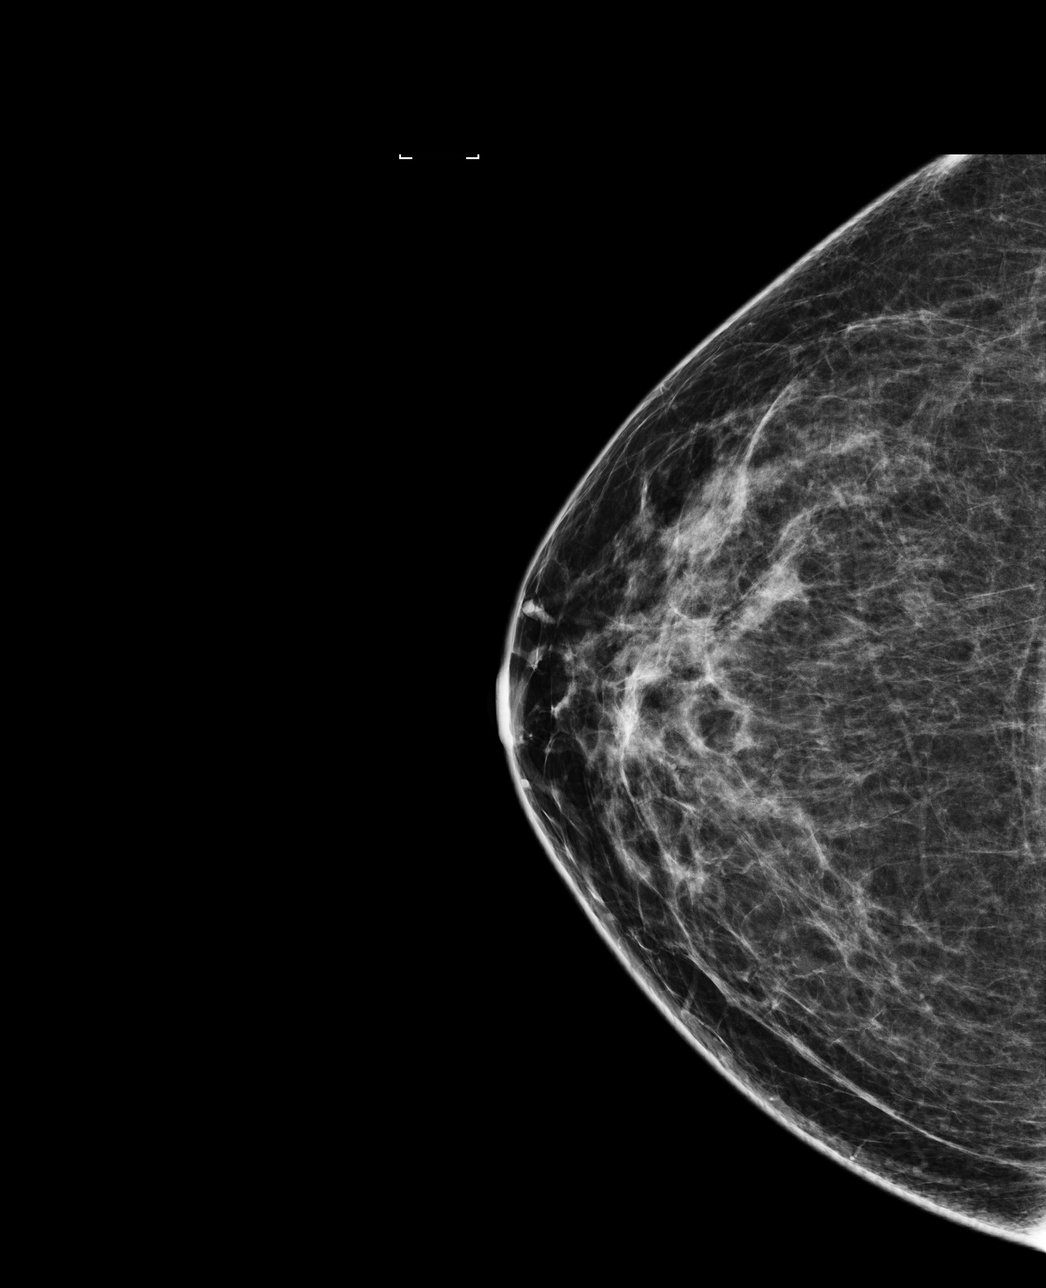

[L CC]
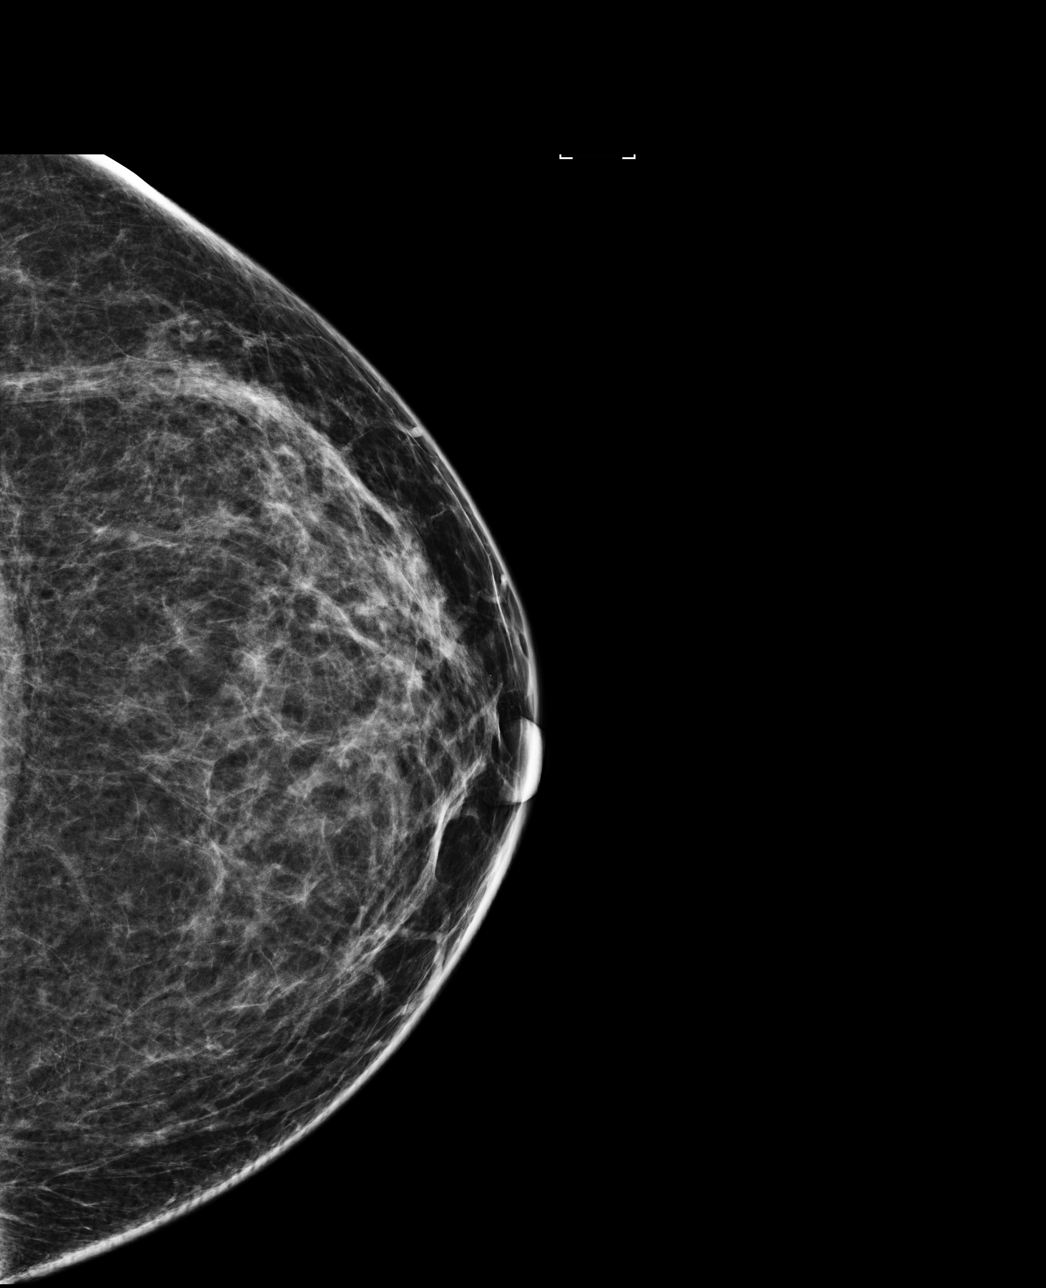

[4 of 4 positions shown; findings below may reference images not displayed]

ACR Breast Density Category c: The breast tissue is heterogeneously
dense, which may obscure small masses.
FINDINGS: There are no findings suspicious for malignancy. Images were
processed with CAD.
IMPRESSION: No mammographic evidence of malignancy. A result letter of this
screening mammogram will be mailed directly to the patient.

RECOMMENDATION:
Screening mammogram in one year. (Code:[id number])

BI-RADS CATEGORY  1: Negative.

## 2020-03-16 DIAGNOSIS — Z6841 Body Mass Index (BMI) 40.0 and over, adult: Secondary | ICD-10-CM | POA: Diagnosis not present

## 2020-03-16 DIAGNOSIS — I10 Essential (primary) hypertension: Secondary | ICD-10-CM | POA: Diagnosis not present

## 2020-03-16 DIAGNOSIS — R635 Abnormal weight gain: Secondary | ICD-10-CM | POA: Diagnosis not present

## 2020-03-23 ENCOUNTER — Other Ambulatory Visit: Payer: Self-pay | Admitting: Surgical Oncology

## 2020-03-23 DIAGNOSIS — K449 Diaphragmatic hernia without obstruction or gangrene: Secondary | ICD-10-CM

## 2020-03-24 ENCOUNTER — Other Ambulatory Visit: Payer: Self-pay | Admitting: Surgical Oncology

## 2020-03-24 DIAGNOSIS — Z6841 Body Mass Index (BMI) 40.0 and over, adult: Secondary | ICD-10-CM

## 2020-04-04 ENCOUNTER — Other Ambulatory Visit: Payer: Self-pay

## 2020-04-04 ENCOUNTER — Encounter: Payer: Self-pay | Admitting: Internal Medicine

## 2020-04-04 ENCOUNTER — Ambulatory Visit (INDEPENDENT_AMBULATORY_CARE_PROVIDER_SITE_OTHER): Payer: BC Managed Care – PPO | Admitting: Internal Medicine

## 2020-04-04 VITALS — BP 120/86 | HR 66 | Temp 99.0°F | Ht 67.0 in | Wt 257.4 lb

## 2020-04-04 DIAGNOSIS — I10 Essential (primary) hypertension: Secondary | ICD-10-CM

## 2020-04-04 DIAGNOSIS — Z6841 Body Mass Index (BMI) 40.0 and over, adult: Secondary | ICD-10-CM

## 2020-04-04 DIAGNOSIS — R413 Other amnesia: Secondary | ICD-10-CM

## 2020-04-04 DIAGNOSIS — R7309 Other abnormal glucose: Secondary | ICD-10-CM

## 2020-04-04 NOTE — Patient Instructions (Signed)

## 2020-04-04 NOTE — Progress Notes (Signed)
I,Katawbba Wiggins,acting as a Education administrator for Maximino Greenland, MD.,have documented all relevant documentation on the behalf of Maximino Greenland, MD,as directed by  Maximino Greenland, MD while in the presence of Maximino Greenland, MD.  This visit occurred during the SARS-CoV-2 public health emergency.  Safety protocols were in place, including screening questions prior to the visit, additional usage of staff PPE, and extensive cleaning of exam room while observing appropriate contact time as indicated for disinfecting solutions.  Subjective:     Patient ID: [name] [name] , female    DOB: [birth year] , 59 y.o.   MRN: [id number]   Chief Complaint  Patient presents with  . Hypertension    HPI  She is here today for a follow-up on her blood pressure.  She reports compliance with meds.   Hypertension This is a chronic problem. The current episode started more than 1 year ago. The problem has been gradually improving since onset. The problem is controlled. Pertinent negatives include no blurred vision, chest pain, palpitations or shortness of breath. Risk factors for coronary artery disease include obesity, post-menopausal state and sedentary lifestyle. The current treatment provides moderate improvement. Compliance problems include exercise.      Past Medical History:  Diagnosis Date  . Abnormal glucose   . Candidiasis   . Cervicalgia   . Essential hypertension 12/07/2016  . Flatulence   . Hypertension   . Localized swelling, mass and lump, head   . Lumbago   . Morbid obesity ([place]) 12/07/2016  . Obesity 12/07/2016  . Onychomycosis      Family History  Problem Relation Age of Onset  . Hypertension Mother   . Heart failure Father   . Diabetes Father   . Heart attack Father   . Breast cancer Neg Hx      Current Outpatient Medications:  .  amLODipine (NORVASC) 5 MG tablet, Take 1 tablet (5 mg total) by mouth daily., Disp: 90 tablet, Rfl: 1 .  Ascorbic Acid (VITAMIN C) 100 MG tablet, Take  100 mg by mouth daily., Disp: , Rfl:  .  BIOTIN PO, Take by mouth., Disp: , Rfl:  .  carvedilol (COREG) 12.5 MG tablet, Take 1 tablet (12.5 mg total) by mouth 2 (two) times daily. Please schedule annual appt with Dr. Oval Linsey for refills. [phone]. 1st attempt., Disp: 180 tablet, Rfl: 1 .  Cholecalciferol (VITAMIN D3) 3000 units TABS, Take 3,000 Units by mouth daily. , Disp: , Rfl:  .  ELDERBERRY PO, Take by mouth., Disp: , Rfl:  .  GLUCOSA-CHONDR-NA CHONDR-MSM PO, Take 1 capsule by mouth daily. 1500/1103 mg, Disp: , Rfl:  .  hydrochlorothiazide (HYDRODIURIL) 12.5 MG tablet, Take 1 tablet (12.5 mg total) by mouth daily., Disp: 90 tablet, Rfl: 1 .  magnesium 30 MG tablet, Take 30 mg by mouth 2 (two) times daily., Disp: , Rfl:  .  Multiple Vitamins-Minerals (CENTRUM SILVER PO), Take 1 tablet by mouth daily. 1 per week or 1 every other day sometimes, Disp: , Rfl:  .  olmesartan-hydrochlorothiazide (BENICAR HCT) 20-12.5 MG tablet, Take 1 tablet by mouth daily., Disp: 90 tablet, Rfl: 1 .  Probiotic Product (PROBIOTIC DAILY PO), Take 1 tablet by mouth daily., Disp: , Rfl:  .  chlorhexidine (PERIDEX) 0.12 % solution, USE 5 ML IN MOUTH  OR THROAT TWICE DAILY AS DIRECTED (Patient not taking: Reported on 04/04/2020), Disp: 473 mL, Rfl: 0 .  cyclobenzaprine (FLEXERIL) 5 MG tablet, Take 1 tablet (5 mg total) by mouth at  bedtime as needed for muscle spasms. (Patient not taking: Reported on 10/05/2019), Disp: 30 tablet, Rfl: 1 .  escitalopram (LEXAPRO) 10 MG tablet, TAKE 1 TABLET BY MOUTH ONCE DAILY. FURTHER REFILLS FROM PRIMARY CARE DR (Patient not taking: Reported on 04/04/2020), Disp: 90 tablet, Rfl: 0 .  LORazepam (ATIVAN) 0.5 MG tablet, Take 1 tablet (0.5 mg total) by mouth every 8 (eight) hours as needed for anxiety. (Patient not taking: Reported on 07/19/2019), Disp: 30 tablet, Rfl: 0 .  meloxicam (MOBIC) 7.5 MG tablet, Take 1 tablet (7.5 mg total) by mouth daily. (Patient not taking: Reported on  10/05/2019), Disp: 15 tablet, Rfl: 0   No Known Allergies   Review of Systems  Constitutional: Negative.   Eyes: Negative for blurred vision and photophobia.  Respiratory: Negative.  Negative for shortness of breath.   Cardiovascular: Negative.  Negative for chest pain and palpitations.  Gastrointestinal: Negative.   Neurological:       She c/o memory issues. She feels that this has been going on for awhile. She can't recall where she puts things from time to time.   Psychiatric/Behavioral: Negative.   All other systems reviewed and are negative.    Today's Vitals   04/04/20 1110  BP: 120/86  Pulse: 66  Temp: 99 F (37.2 C)  TempSrc: Oral  Weight: 257 lb 6.4 oz (116.8 kg)  Height: 5\' 7"  (1.702 m)   Body mass index is 40.31 kg/m.   Objective:  Physical Exam Vitals and nursing note reviewed.  Constitutional:      Appearance: Normal appearance. She is obese.  HENT:     Head: Normocephalic and atraumatic.  Cardiovascular:     Rate and Rhythm: Normal rate and regular rhythm.     Heart sounds: Normal heart sounds.  Pulmonary:     Breath sounds: Normal breath sounds.  Skin:    General: Skin is warm.  Neurological:     General: No focal deficit present.     Mental Status: She is alert and oriented to person, place, and time.         Assessment And Plan:     1. Essential hypertension Comments: Chronic, well controlled. She will continue with her current meds. She will rto in six months for re-evaluation.   2. Memory changes Comments: I will check labs as listed below. I will make further recommendations once her labs are available for review.  - Vitamin B12 - TSH  3. Other abnormal glucose  HER A1C HAS BEEN ELEVATED IN THE PAST. I WILL CHECK AN A1C, BMET TODAY. SHE WAS ENCOURAGED TO AVOID SUGARY BEVERAGES AND PROCESSED FOODS INCLUDNG BREADS, RICE AND PASTA.  - Hemoglobin A1c  4. Class 3 severe obesity due to excess calories with serious comorbidity and body  mass index (BMI) of 40.0 to 44.9 in adult [facility]) Comments: She is thinking about pursuing bariatric surgery.  She has tried [employer], [facility], rx meds without any improvement in her condition. She has worked with a Physiological scientist in the past, but has been unsuccessful in maintaining her weight loss. Patient advised that I support her decision and will review her notes from [facility].    Patient was given opportunity to ask questions. Patient verbalized understanding of the plan and was able to repeat key elements of the plan. All questions were answered to their satisfaction.  Maximino Greenland, MD   I, Maximino Greenland, MD, have reviewed all documentation for this visit. The documentation on 04/04/20 for the  exam, diagnosis, procedures, and orders are all accurate and complete.  THE PATIENT IS ENCOURAGED TO PRACTICE SOCIAL DISTANCING DUE TO THE COVID-19 PANDEMIC.

## 2020-04-05 DIAGNOSIS — Z713 Dietary counseling and surveillance: Secondary | ICD-10-CM | POA: Diagnosis not present

## 2020-04-05 LAB — HEMOGLOBIN A1C
Est. average glucose Bld gHb Est-mCnc: 117 mg/dL
Hgb A1c MFr Bld: 5.7 % — ABNORMAL HIGH (ref 4.8–5.6)

## 2020-04-05 LAB — VITAMIN B12: Vitamin B-12: 320 pg/mL (ref 232–1245)

## 2020-04-05 LAB — TSH: TSH: 1.01 u[IU]/mL (ref 0.450–4.500)

## 2020-04-07 DIAGNOSIS — I10 Essential (primary) hypertension: Secondary | ICD-10-CM | POA: Diagnosis not present

## 2020-04-07 DIAGNOSIS — Z6841 Body Mass Index (BMI) 40.0 and over, adult: Secondary | ICD-10-CM | POA: Diagnosis not present

## 2020-04-13 ENCOUNTER — Other Ambulatory Visit: Payer: Self-pay | Admitting: Surgical Oncology

## 2020-04-13 ENCOUNTER — Ambulatory Visit
Admission: RE | Admit: 2020-04-13 | Discharge: 2020-04-13 | Disposition: A | Discharge status: Home / Self Care | Payer: BC Managed Care – PPO | Source: Ambulatory Visit | Attending: Surgical Oncology | Admitting: Surgical Oncology

## 2020-04-13 DIAGNOSIS — Z6841 Body Mass Index (BMI) 40.0 and over, adult: Secondary | ICD-10-CM

## 2020-04-13 DIAGNOSIS — Z9884 Bariatric surgery status: Secondary | ICD-10-CM | POA: Diagnosis not present

## 2020-04-13 DIAGNOSIS — Z01818 Encounter for other preprocedural examination: Secondary | ICD-10-CM | POA: Diagnosis not present

## 2020-04-19 DIAGNOSIS — Z713 Dietary counseling and surveillance: Secondary | ICD-10-CM | POA: Diagnosis not present

## 2020-04-27 ENCOUNTER — Other Ambulatory Visit: Payer: Self-pay | Admitting: Internal Medicine

## 2020-05-24 DIAGNOSIS — Z6839 Body mass index (BMI) 39.0-39.9, adult: Secondary | ICD-10-CM | POA: Diagnosis not present

## 2020-05-24 DIAGNOSIS — Z713 Dietary counseling and surveillance: Secondary | ICD-10-CM | POA: Diagnosis not present

## 2020-05-25 ENCOUNTER — Other Ambulatory Visit: Payer: Self-pay | Admitting: Internal Medicine

## 2020-06-01 DIAGNOSIS — F4320 Adjustment disorder, unspecified: Secondary | ICD-10-CM | POA: Diagnosis not present

## 2020-06-15 DIAGNOSIS — F54 Psychological and behavioral factors associated with disorders or diseases classified elsewhere: Secondary | ICD-10-CM | POA: Diagnosis not present

## 2020-06-15 DIAGNOSIS — F4320 Adjustment disorder, unspecified: Secondary | ICD-10-CM | POA: Diagnosis not present

## 2020-06-15 DIAGNOSIS — E669 Obesity, unspecified: Secondary | ICD-10-CM | POA: Diagnosis not present

## 2020-06-27 ENCOUNTER — Other Ambulatory Visit: Payer: Self-pay | Admitting: Internal Medicine

## 2020-07-10 DIAGNOSIS — Z713 Dietary counseling and surveillance: Secondary | ICD-10-CM | POA: Diagnosis not present

## 2020-07-27 DIAGNOSIS — Z6841 Body Mass Index (BMI) 40.0 and over, adult: Secondary | ICD-10-CM | POA: Diagnosis not present

## 2020-07-27 DIAGNOSIS — Z01818 Encounter for other preprocedural examination: Secondary | ICD-10-CM | POA: Diagnosis not present

## 2020-07-27 DIAGNOSIS — I10 Essential (primary) hypertension: Secondary | ICD-10-CM | POA: Diagnosis not present

## 2020-07-27 DIAGNOSIS — R9431 Abnormal electrocardiogram [ECG] [EKG]: Secondary | ICD-10-CM | POA: Diagnosis not present

## 2020-07-27 DIAGNOSIS — D472 Monoclonal gammopathy: Secondary | ICD-10-CM | POA: Diagnosis not present

## 2020-07-27 DIAGNOSIS — Z6839 Body mass index (BMI) 39.0-39.9, adult: Secondary | ICD-10-CM | POA: Diagnosis not present

## 2020-07-28 DIAGNOSIS — I10 Essential (primary) hypertension: Secondary | ICD-10-CM | POA: Diagnosis not present

## 2020-07-28 DIAGNOSIS — Z01810 Encounter for preprocedural cardiovascular examination: Secondary | ICD-10-CM | POA: Diagnosis not present

## 2020-07-28 DIAGNOSIS — Z6841 Body Mass Index (BMI) 40.0 and over, adult: Secondary | ICD-10-CM | POA: Diagnosis not present

## 2020-08-08 DIAGNOSIS — R9431 Abnormal electrocardiogram [ECG] [EKG]: Secondary | ICD-10-CM | POA: Diagnosis not present

## 2020-08-08 DIAGNOSIS — Z6841 Body Mass Index (BMI) 40.0 and over, adult: Secondary | ICD-10-CM | POA: Diagnosis not present

## 2020-08-08 DIAGNOSIS — M6208 Separation of muscle (nontraumatic), other site: Secondary | ICD-10-CM | POA: Diagnosis not present

## 2020-08-08 DIAGNOSIS — I10 Essential (primary) hypertension: Secondary | ICD-10-CM | POA: Diagnosis not present

## 2020-08-08 DIAGNOSIS — D472 Monoclonal gammopathy: Secondary | ICD-10-CM | POA: Diagnosis not present

## 2020-08-08 HISTORY — PX: LAPAROSCOPIC GASTRIC SLEEVE RESECTION: SHX5895

## 2020-08-09 DIAGNOSIS — Z6841 Body Mass Index (BMI) 40.0 and over, adult: Secondary | ICD-10-CM | POA: Diagnosis not present

## 2020-08-09 DIAGNOSIS — R9431 Abnormal electrocardiogram [ECG] [EKG]: Secondary | ICD-10-CM | POA: Diagnosis not present

## 2020-08-09 DIAGNOSIS — I10 Essential (primary) hypertension: Secondary | ICD-10-CM | POA: Diagnosis not present

## 2020-08-09 DIAGNOSIS — D472 Monoclonal gammopathy: Secondary | ICD-10-CM | POA: Diagnosis not present

## 2020-08-10 ENCOUNTER — Other Ambulatory Visit: Payer: Self-pay | Admitting: Cardiovascular Disease

## 2020-08-10 DIAGNOSIS — Z6841 Body Mass Index (BMI) 40.0 and over, adult: Secondary | ICD-10-CM | POA: Diagnosis not present

## 2020-08-10 DIAGNOSIS — D472 Monoclonal gammopathy: Secondary | ICD-10-CM | POA: Diagnosis not present

## 2020-08-10 DIAGNOSIS — I10 Essential (primary) hypertension: Secondary | ICD-10-CM | POA: Diagnosis not present

## 2020-08-10 DIAGNOSIS — R9431 Abnormal electrocardiogram [ECG] [EKG]: Secondary | ICD-10-CM | POA: Diagnosis not present

## 2020-09-13 ENCOUNTER — Other Ambulatory Visit: Payer: Self-pay | Admitting: Internal Medicine

## 2020-09-13 DIAGNOSIS — Z903 Acquired absence of stomach [part of]: Secondary | ICD-10-CM | POA: Diagnosis not present

## 2020-09-13 DIAGNOSIS — Z713 Dietary counseling and surveillance: Secondary | ICD-10-CM | POA: Diagnosis not present

## 2020-09-20 ENCOUNTER — Other Ambulatory Visit: Payer: Self-pay | Admitting: Internal Medicine

## 2020-09-20 DIAGNOSIS — Z1231 Encounter for screening mammogram for malignant neoplasm of breast: Secondary | ICD-10-CM

## 2020-10-05 ENCOUNTER — Other Ambulatory Visit (HOSPITAL_COMMUNITY)
Admission: RE | Admit: 2020-10-05 | Discharge: 2020-10-05 | Disposition: A | Discharge status: Home / Self Care | Payer: BC Managed Care – PPO | Source: Ambulatory Visit | Attending: Internal Medicine | Admitting: Internal Medicine

## 2020-10-05 ENCOUNTER — Ambulatory Visit (INDEPENDENT_AMBULATORY_CARE_PROVIDER_SITE_OTHER): Payer: BC Managed Care – PPO | Admitting: Internal Medicine

## 2020-10-05 ENCOUNTER — Encounter: Payer: Self-pay | Admitting: Internal Medicine

## 2020-10-05 VITALS — BP 132/76 | HR 65 | Temp 97.8°F | Ht 67.4 in | Wt 223.0 lb

## 2020-10-05 DIAGNOSIS — Z9884 Bariatric surgery status: Secondary | ICD-10-CM

## 2020-10-05 DIAGNOSIS — Z01419 Encounter for gynecological examination (general) (routine) without abnormal findings: Secondary | ICD-10-CM

## 2020-10-05 DIAGNOSIS — E6609 Other obesity due to excess calories: Secondary | ICD-10-CM

## 2020-10-05 DIAGNOSIS — Z0000 Encounter for general adult medical examination without abnormal findings: Secondary | ICD-10-CM

## 2020-10-05 DIAGNOSIS — Z6834 Body mass index (BMI) 34.0-34.9, adult: Secondary | ICD-10-CM

## 2020-10-05 DIAGNOSIS — I10 Essential (primary) hypertension: Secondary | ICD-10-CM

## 2020-10-05 DIAGNOSIS — Z79899 Other long term (current) drug therapy: Secondary | ICD-10-CM | POA: Diagnosis not present

## 2020-10-05 DIAGNOSIS — R7309 Other abnormal glucose: Secondary | ICD-10-CM | POA: Diagnosis not present

## 2020-10-05 LAB — POCT URINALYSIS DIPSTICK
Bilirubin, UA: NEGATIVE
Blood, UA: NEGATIVE
Glucose, UA: NEGATIVE
Ketones, UA: NEGATIVE
Leukocytes, UA: NEGATIVE
Nitrite, UA: NEGATIVE
Protein, UA: NEGATIVE
Spec Grav, UA: 1.02 (ref 1.010–1.025)
Urobilinogen, UA: 0.2 E.U./dL
pH, UA: 7.5 (ref 5.0–8.0)

## 2020-10-05 LAB — POCT UA - MICROALBUMIN
Albumin/Creatinine Ratio, Urine, POC: <30
Creatinine, POC: 100 mg/dL
Microalbumin Ur, POC: 30 mg/L

## 2020-10-05 MED ORDER — AMLODIPINE BESYLATE 5 MG PO TABS
5.0000 mg | ORAL_TABLET | Freq: Every day | ORAL | 1 refills | Status: DC
Start: 2020-10-05 — End: 2021-06-19

## 2020-10-05 NOTE — Patient Instructions (Addendum)
Meralgia paresthetica  Health Maintenance, Female Adopting a healthy lifestyle and getting preventive care are important in promoting health and wellness. Ask your health care provider about:  The right schedule for you to have regular tests and exams.  Things you can do on your own to prevent diseases and keep yourself healthy. What should I know about diet, weight, and exercise? Eat a healthy diet  Eat a diet that includes plenty of vegetables, fruits, low-fat dairy products, and lean protein.  Do not eat a lot of foods that are high in solid fats, added sugars, or sodium.   Maintain a healthy weight Body mass index (BMI) is used to identify weight problems. It estimates body fat based on height and weight. Your health care provider can help determine your BMI and help you achieve or maintain a healthy weight. Get regular exercise Get regular exercise. This is one of the most important things you can do for your health. Most adults should:  Exercise for at least 150 minutes each week. The exercise should increase your heart rate and make you sweat (moderate-intensity exercise).  Do strengthening exercises at least twice a week. This is in addition to the moderate-intensity exercise.  Spend less time sitting. Even light physical activity can be beneficial. Watch cholesterol and blood lipids Have your blood tested for lipids and cholesterol at 58 years of age, then have this test every 5 years. Have your cholesterol levels checked more often if:  Your lipid or cholesterol levels are high.  You are older than 58 years of age.  You are at high risk for heart disease. What should I know about cancer screening? Depending on your health history and family history, you may need to have cancer screening at various ages. This may include screening for:  Breast cancer.  Cervical cancer.  Colorectal cancer.  Skin cancer.  Lung cancer. What should I know about heart disease,  diabetes, and high blood pressure? Blood pressure and heart disease  High blood pressure causes heart disease and increases the risk of stroke. This is more likely to develop in people who have high blood pressure readings, are of African descent, or are overweight.  Have your blood pressure checked: ? Every 3-5 years if you are 32-48 years of age. ? Every year if you are 7 years old or older. Diabetes Have regular diabetes screenings. This checks your fasting blood sugar level. Have the screening done:  Once every three years after age 20 if you are at a normal weight and have a low risk for diabetes.  More often and at a younger age if you are overweight or have a high risk for diabetes. What should I know about preventing infection? Hepatitis B If you have a higher risk for hepatitis B, you should be screened for this virus. Talk with your health care provider to find out if you are at risk for hepatitis B infection. Hepatitis C Testing is recommended for:  Everyone born from [birth year] through 1963.  Anyone with known risk factors for hepatitis C. Sexually transmitted infections (STIs)  Get screened for STIs, including gonorrhea and chlamydia, if: ? You are sexually active and are younger than 58 years of age. ? You are older than 58 years of age and your health care provider tells you that you are at risk for this type of infection. ? Your sexual activity has changed since you were last screened, and you are at increased risk for chlamydia or gonorrhea. Ask your  health care provider if you are at risk.  Ask your health care provider about whether you are at high risk for HIV. Your health care provider may recommend a prescription medicine to help prevent HIV infection. If you choose to take medicine to prevent HIV, you should first get tested for HIV. You should then be tested every 3 months for as long as you are taking the medicine. Pregnancy  If you are about to stop having your  period (premenopausal) and you may become pregnant, seek counseling before you get pregnant.  Take 400 to 800 micrograms (mcg) of folic acid every day if you become pregnant.  Ask for birth control (contraception) if you want to prevent pregnancy. Osteoporosis and menopause Osteoporosis is a disease in which the bones lose minerals and strength with aging. This can result in bone fractures. If you are 50 years old or older, or if you are at risk for osteoporosis and fractures, ask your health care provider if you should:  Be screened for bone loss.  Take a calcium or vitamin D supplement to lower your risk of fractures.  Be given hormone replacement therapy (HRT) to treat symptoms of menopause. Follow these instructions at home: Lifestyle  Do not use any products that contain nicotine or tobacco, such as cigarettes, e-cigarettes, and chewing tobacco. If you need help quitting, ask your health care provider.  Do not use street drugs.  Do not share needles.  Ask your health care provider for help if you need support or information about quitting drugs. Alcohol use  Do not drink alcohol if: ? Your health care provider tells you not to drink. ? You are pregnant, may be pregnant, or are planning to become pregnant.  If you drink alcohol: ? Limit how much you use to 0-1 drink a day. ? Limit intake if you are breastfeeding.  Be aware of how much alcohol is in your drink. In the [place] one drink equals one 12 oz bottle of beer (355 mL), one 5 oz glass of wine (148 mL), or one 1 oz glass of hard liquor (44 mL). General instructions  Schedule regular health, dental, and eye exams.  Stay current with your vaccines.  Tell your health care provider if: ? You often feel depressed. ? You have ever been abused or do not feel safe at home. Summary  Adopting a healthy lifestyle and getting preventive care are important in promoting health and wellness.  Follow your health care provider's  instructions about healthy diet, exercising, and getting tested or screened for diseases.  Follow your health care provider's instructions on monitoring your cholesterol and blood pressure. This information is not intended to replace advice given to you by your health care provider. Make sure you discuss any questions you have with your health care provider. Document Revised: 07/29/2018 Document Reviewed: 07/29/2018 Elsevier Patient Education  2021 [employer].

## 2020-10-05 NOTE — Progress Notes (Signed)
I,Katawbba Wiggins,acting as a Education administrator for Maximino Greenland, MD.,have documented all relevant documentation on the behalf of Maximino Greenland, MD,as directed by  Maximino Greenland, MD while in the presence of Maximino Greenland, MD.  This visit occurred during the SARS-CoV-2 public health emergency.  Safety protocols were in place, including screening questions prior to the visit, additional usage of staff PPE, and extensive cleaning of exam room while observing appropriate contact time as indicated for disinfecting solutions.  Subjective:     Patient ID: [name] [name] , female    DOB: 1961-01-16 , 60 y.o.   MRN: [id number]   Chief Complaint  Patient presents with  . Annual Exam  . Hypertension    HPI  She is here today for a full physical examination with a pap.  She is not followed by GYN. She is s/p hysterectomy. Additionally, she had gastric sleeve surgery performed Dec 2021. She has not had any complications since this procedure. She is pleased with her weight loss thus far.   Hypertension This is a chronic problem. The current episode started more than 1 year ago. The problem has been gradually improving since onset. The problem is controlled. Pertinent negatives include no blurred vision, chest pain, palpitations or shortness of breath. Risk factors for coronary artery disease include obesity, post-menopausal state and sedentary lifestyle. The current treatment provides moderate improvement. Compliance problems include exercise.      Past Medical History:  Diagnosis Date  . Abnormal glucose   . Candidiasis   . Cervicalgia   . Essential hypertension 12/07/2016  . Flatulence   . Hypertension   . Localized swelling, mass and lump, head   . Lumbago   . Morbid obesity ([place]) 12/07/2016  . Obesity 12/07/2016  . Onychomycosis      Family History  Problem Relation Age of Onset  . Hypertension Mother   . Heart failure Father   . Diabetes Father   . Heart attack Father   . Breast cancer  Neg Hx      Current Outpatient Medications:  .  Calcium Citrate-Vitamin D (CELEBRATE CALCIUM PLUS 500 PO), Take by mouth. 3 chewables daily, Disp: , Rfl:  .  carvedilol (COREG) 12.5 MG tablet, TAKE 1 TABLET BY MOUTH TWICE DAILY(SCHEDULE ANNUAL APPT WITH DR [facility]), Disp: 180 tablet, Rfl: 0 .  Multiple Vitamins-Minerals (CELEBRATE MULTI-COMPLETE 18) CHEW, Chew by mouth. 1 per day, Disp: , Rfl:  .  omeprazole (PRILOSEC) 20 MG capsule, , Disp: , Rfl:  .  Probiotic Product (PROBIOTIC DAILY PO), Take 1 tablet by mouth daily., Disp: , Rfl:  .  ursodiol (ACTIGALL) 250 MG tablet, Take 250 mg by mouth 2 (two) times daily., Disp: , Rfl:  .  amLODipine (NORVASC) 5 MG tablet, Take 1 tablet (5 mg total) by mouth daily., Disp: 90 tablet, Rfl: 1   No Known Allergies   The patient states she uses status post hysterectomy for birth control. Last LMP was No LMP recorded (lmp unknown). Patient has had a hysterectomy.. Negative for Dysmenorrhea. Negative for: breast discharge, breast lump(s), breast pain and breast self exam. Associated symptoms include abnormal vaginal bleeding. Pertinent negatives include abnormal bleeding (hematology), anxiety, decreased libido, depression, difficulty falling sleep, dyspareunia, history of infertility, nocturia, sexual dysfunction, sleep disturbances, urinary incontinence, urinary urgency, vaginal discharge and vaginal itching. Diet regular.The patient states her exercise level is  moderate.  . The patient's tobacco use is:  Social History   Tobacco Use  Smoking Status Never Smoker  Smokeless Tobacco Never Used  . She has been exposed to passive smoke. The patient's alcohol use is:  Social History   Substance and Sexual Activity  Alcohol Use None    Review of Systems  Constitutional: Negative.   HENT: Negative.   Eyes: Negative.  Negative for blurred vision.  Respiratory: Negative.  Negative for shortness of breath.   Cardiovascular: Negative.  Negative for chest  pain and palpitations.  Gastrointestinal: Negative.   Endocrine: Negative.   Genitourinary: Negative.   Musculoskeletal: Negative.   Skin: Negative.   Allergic/Immunologic: Negative.   Neurological: Negative.   Hematological: Negative.   Psychiatric/Behavioral: Negative.      Today's Vitals   10/05/20 1012  BP: 132/76  Pulse: 65  Temp: 97.8 F (36.6 C)  TempSrc: Oral  Weight: 223 lb (101.2 kg)  Height: 5' 7.4" (1.712 m)   Body mass index is 34.51 kg/m.   Objective:  Physical Exam Vitals and nursing note reviewed. Exam conducted with a chaperone present.  Constitutional:      General: She is not in acute distress.    Appearance: Normal appearance. She is well-developed. She is obese.  HENT:     Head: Normocephalic and atraumatic.     Right Ear: Hearing, tympanic membrane, ear canal and external ear normal. There is no impacted cerumen.     Left Ear: Hearing, tympanic membrane, ear canal and external ear normal. There is no impacted cerumen.     Nose:     Comments: Masked     Mouth/Throat:     Comments: Masked  Eyes:     General: Lids are normal.     Extraocular Movements: Extraocular movements intact.     Conjunctiva/sclera: Conjunctivae normal.     Pupils: Pupils are equal, round, and reactive to light.  Neck:     Thyroid: No thyroid mass.     Vascular: No carotid bruit.  Cardiovascular:     Rate and Rhythm: Normal rate and regular rhythm.     Pulses: Normal pulses.     Heart sounds: Normal heart sounds. No murmur heard.   Pulmonary:     Effort: Pulmonary effort is normal.     Breath sounds: Normal breath sounds.  Chest:  Breasts:     Tanner Score is 5.     Right: Normal.     Left: Normal.    Abdominal:     General: Bowel sounds are normal. There is no distension.     Palpations: Abdomen is soft.     Tenderness: There is no abdominal tenderness.  Genitourinary:    General: Normal vulva.     Exam position: Lithotomy position.     Tanner stage  (genital): 5.     Labia:        Right: No rash.        Left: No rash.      Vagina: Normal. No vaginal discharge.     Uterus: Absent.      Adnexa:        Right: No tenderness.         Left: No tenderness.       Rectum: Normal. Guaiac result negative.     Comments: Absent cervix Musculoskeletal:        General: No swelling. Normal range of motion.     Cervical back: Full passive range of motion without pain, normal range of motion and neck supple.     Right lower leg: No edema.     Left  lower leg: No edema.  Lymphadenopathy:     Lower Body: No right inguinal adenopathy. No left inguinal adenopathy.  Skin:    General: Skin is warm and dry.     Capillary Refill: Capillary refill takes less than 2 seconds.  Neurological:     General: No focal deficit present.     Mental Status: She is alert and oriented to person, place, and time.     Cranial Nerves: No cranial nerve deficit.     Sensory: No sensory deficit.  Psychiatric:        Mood and Affect: Mood normal.        Behavior: Behavior normal.        Thought Content: Thought content normal.        Judgment: Judgment normal.         Assessment And Plan:     1. Routine general medical examination at health care facility Comments: A full exam was performed. Importance of monthly self breast exams was discussed with the patient. PATIENT IS ADVISED TO GET 30-45 MINUTES REGULAR EXERCISE NO LESS THAN FOUR TO FIVE DAYS PER WEEK - BOTH WEIGHTBEARING EXERCISES AND AEROBIC ARE RECOMMENDED.  PATIENT IS ADVISED TO FOLLOW A HEALTHY DIET WITH AT LEAST SIX FRUITS/VEGGIES PER DAY, DECREASE INTAKE OF RED MEAT, AND TO INCREASE FISH INTAKE TO TWO DAYS PER WEEK.  MEATS/FISH SHOULD NOT BE FRIED, BAKED OR BROILED IS PREFERABLE.  I SUGGEST WEARING SPF 50 SUNSCREEN ON EXPOSED PARTS AND ESPECIALLY WHEN IN THE DIRECT SUNLIGHT FOR AN EXTENDED PERIOD OF TIME.  PLEASE AVOID FAST FOOD RESTAURANTS AND INCREASE YOUR WATER INTAKE.  - Hemoglobin A1c - CBC -  CMP14+EGFR - Lipid panel  2. Encntr for gyn exam (general) (routine) w/o abn findings Comments: Pap smear performed. Stool is heme negative.  - Cytology -Pap Smear  3. Essential hypertension Comments: Chronic, controlled. She will c/w amlodipine and carvedilol. EKG performed, NSR w/ nonspecific T wave abnormality. She  Is encouraged to follow a low sodium diet. She will f/u in 6 months.  - POCT Urinalysis Dipstick (81002) - POCT UA - Microalbumin - EKG 12-Lead  4. History of bariatric surgery Comments: I will check labs as listed below. I will add supplementation as needed.  - Vitamin D (25 hydroxy) - Vitamin B12  5. Class 1 obesity due to excess calories with serious comorbidity and body mass index (BMI) of 34.0 to 34.9 in adult She is s/p bariatric sleeve surgery Dec 2021. She reports losing about 34 pounds since her surgery. She was congratulated on her weight loss thus far.  She is encouraged to strive for BMI less than 30 to decrease cardiac risk. Advised to aim for at least 150 minutes of exercise per week.  Patient was given opportunity to ask questions. Patient verbalized understanding of the plan and was able to repeat key elements of the plan. All questions were answered to their satisfaction.   I, Maximino Greenland, MD, have reviewed all documentation for this visit. The documentation on 10/05/20 for the exam, diagnosis, procedures, and orders are all accurate and complete.  THE PATIENT IS ENCOURAGED TO PRACTICE SOCIAL DISTANCING DUE TO THE COVID-19 PANDEMIC.

## 2020-10-06 LAB — HEMOGLOBIN A1C
Est. average glucose Bld gHb Est-mCnc: 97 mg/dL
Hgb A1c MFr Bld: 5 % (ref 4.8–5.6)

## 2020-10-06 LAB — CMP14+EGFR
ALT: 6 IU/L (ref 0–32)
AST: 15 IU/L (ref 0–40)
Albumin/Globulin Ratio: 1.3 (ref 1.2–2.2)
Albumin: 4.3 g/dL (ref 3.8–4.9)
Alkaline Phosphatase: 58 IU/L (ref 44–121)
BUN/Creatinine Ratio: 22 (ref 9–23)
BUN: 17 mg/dL (ref 6–24)
Bilirubin Total: 0.3 mg/dL (ref 0.0–1.2)
CO2: 24 mmol/L (ref 20–29)
Calcium: 9.3 mg/dL (ref 8.7–10.2)
Chloride: 101 mmol/L (ref 96–106)
Creatinine, Ser: 0.78 mg/dL (ref 0.57–1.00)
GFR calc Af Amer: 98 mL/min/{1.73_m2} (ref >59)
GFR calc non Af Amer: 85 mL/min/{1.73_m2} (ref >59)
Globulin, Total: 3.2 g/dL (ref 1.5–4.5)
Glucose: 57 mg/dL — ABNORMAL LOW (ref 65–99)
Potassium: 3.8 mmol/L (ref 3.5–5.2)
Sodium: 143 mmol/L (ref 134–144)
Total Protein: 7.5 g/dL (ref 6.0–8.5)

## 2020-10-06 LAB — CBC
Hematocrit: 38.8 % (ref 34.0–46.6)
Hemoglobin: 12.2 g/dL (ref 11.1–15.9)
MCH: 27.9 pg (ref 26.6–33.0)
MCHC: 31.4 g/dL — ABNORMAL LOW (ref 31.5–35.7)
MCV: 89 fL (ref 79–97)
Platelets: 189 10*3/uL (ref 150–450)
RBC: 4.37 x10E6/uL (ref 3.77–5.28)
RDW: 15.7 % — ABNORMAL HIGH (ref 11.7–15.4)
WBC: 3.5 10*3/uL (ref 3.4–10.8)

## 2020-10-06 LAB — LIPID PANEL
Chol/HDL Ratio: 3.6 ratio (ref 0.0–4.4)
Cholesterol, Total: 195 mg/dL (ref 100–199)
HDL: 54 mg/dL (ref >39)
LDL Chol Calc (NIH): 127 mg/dL — ABNORMAL HIGH (ref 0–99)
Triglycerides: 74 mg/dL (ref 0–149)
VLDL Cholesterol Cal: 14 mg/dL (ref 5–40)

## 2020-10-06 LAB — VITAMIN D 25 HYDROXY (VIT D DEFICIENCY, FRACTURES): Vit D, 25-Hydroxy: 84.8 ng/mL (ref 30.0–100.0)

## 2020-10-06 LAB — VITAMIN B12: Vitamin B-12: 325 pg/mL (ref 232–1245)

## 2020-10-08 ENCOUNTER — Encounter: Payer: Self-pay | Admitting: Internal Medicine

## 2020-10-09 LAB — CYTOLOGY - PAP
Comment: NEGATIVE
Diagnosis: NEGATIVE
High risk HPV: NEGATIVE

## 2020-10-12 ENCOUNTER — Telehealth: Payer: Self-pay

## 2020-10-12 ENCOUNTER — Other Ambulatory Visit: Payer: Self-pay

## 2020-10-12 NOTE — Telephone Encounter (Signed)
The pt wants to know if she can do a sublingual vitamin b12 supplement because it would be cheaper instead of the monthly injection and want to know if she can take metamucil to help with her cholesterol.

## 2020-10-12 NOTE — Telephone Encounter (Signed)
Yes, if that is what she chooses to do. I will recheck levels at her next visit.

## 2020-10-12 NOTE — Telephone Encounter (Signed)
The pt was told that Dr Baird [facility] said yes that the pt can do a sublingual vit b12 and metamucil fiber, the pt wanted to know how much a b12 injection would cost and the pt was transferred to billing.

## 2020-10-20 ENCOUNTER — Ambulatory Visit: Payer: BC Managed Care – PPO

## 2020-10-20 ENCOUNTER — Ambulatory Visit
Admission: RE | Admit: 2020-10-20 | Discharge: 2020-10-20 | Disposition: A | Discharge status: Home / Self Care | Payer: BC Managed Care – PPO | Source: Ambulatory Visit | Attending: Internal Medicine | Admitting: Internal Medicine

## 2020-10-20 ENCOUNTER — Other Ambulatory Visit: Payer: Self-pay

## 2020-10-20 DIAGNOSIS — Z1231 Encounter for screening mammogram for malignant neoplasm of breast: Secondary | ICD-10-CM | POA: Diagnosis not present

## 2020-11-20 DIAGNOSIS — F4320 Adjustment disorder, unspecified: Secondary | ICD-10-CM | POA: Diagnosis not present

## 2020-11-29 DIAGNOSIS — Z713 Dietary counseling and surveillance: Secondary | ICD-10-CM | POA: Diagnosis not present

## 2020-12-07 DIAGNOSIS — Z903 Acquired absence of stomach [part of]: Secondary | ICD-10-CM | POA: Diagnosis not present

## 2020-12-19 ENCOUNTER — Other Ambulatory Visit: Payer: Self-pay

## 2020-12-19 ENCOUNTER — Ambulatory Visit (INDEPENDENT_AMBULATORY_CARE_PROVIDER_SITE_OTHER): Payer: BC Managed Care – PPO

## 2020-12-19 VITALS — BP 122/74 | HR 60 | Temp 98.1°F | Ht 67.0 in | Wt 203.0 lb

## 2020-12-19 DIAGNOSIS — E538 Deficiency of other specified B group vitamins: Secondary | ICD-10-CM

## 2020-12-19 MED ORDER — CYANOCOBALAMIN 1000 MCG/ML IJ SOLN
1000.0000 ug | Freq: Once | INTRAMUSCULAR | Status: AC
  Administered 2020-12-19: 1000 ug via INTRAMUSCULAR

## 2020-12-19 NOTE — Progress Notes (Signed)
Pt is here today or b12 injection.

## 2020-12-23 ENCOUNTER — Other Ambulatory Visit: Payer: Self-pay | Admitting: Internal Medicine

## 2021-01-18 ENCOUNTER — Other Ambulatory Visit: Payer: Self-pay

## 2021-01-18 ENCOUNTER — Inpatient Hospital Stay: Payer: BC Managed Care – PPO

## 2021-01-18 ENCOUNTER — Inpatient Hospital Stay: Payer: BC Managed Care – PPO | Attending: Oncology | Admitting: Oncology

## 2021-01-18 VITALS — BP 158/94 | HR 59 | Temp 98.8°F | Resp 18 | Wt 199.5 lb

## 2021-01-18 DIAGNOSIS — Z79899 Other long term (current) drug therapy: Secondary | ICD-10-CM | POA: Insufficient documentation

## 2021-01-18 DIAGNOSIS — D472 Monoclonal gammopathy: Secondary | ICD-10-CM | POA: Diagnosis not present

## 2021-01-18 DIAGNOSIS — R768 Other specified abnormal immunological findings in serum: Secondary | ICD-10-CM | POA: Insufficient documentation

## 2021-01-18 LAB — CMP (CANCER CENTER ONLY)
ALT: <6 U/L (ref 0–44)
AST: 14 U/L — ABNORMAL LOW (ref 15–41)
Albumin: 4.1 g/dL (ref 3.5–5.0)
Alkaline Phosphatase: 65 U/L (ref 38–126)
Anion gap: 12 (ref 5–15)
BUN: 17 mg/dL (ref 6–20)
CO2: 27 mmol/L (ref 22–32)
Calcium: 9.4 mg/dL (ref 8.9–10.3)
Chloride: 102 mmol/L (ref 98–111)
Creatinine: 0.86 mg/dL (ref 0.44–1.00)
GFR, Estimated: >60 mL/min (ref >60)
Glucose, Bld: 89 mg/dL (ref 70–99)
Potassium: 3.5 mmol/L (ref 3.5–5.1)
Sodium: 141 mmol/L (ref 135–145)
Total Bilirubin: 0.7 mg/dL (ref 0.3–1.2)
Total Protein: 8.1 g/dL (ref 6.5–8.1)

## 2021-01-18 LAB — CBC WITH DIFFERENTIAL (CANCER CENTER ONLY)
Abs Immature Granulocytes: 0.01 10*3/uL (ref 0.00–0.07)
Basophils Absolute: 0 10*3/uL (ref 0.0–0.1)
Basophils Relative: 0 %
Eosinophils Absolute: 0 10*3/uL (ref 0.0–0.5)
Eosinophils Relative: 1 %
HCT: 37.9 % (ref 36.0–46.0)
Hemoglobin: 12.3 g/dL (ref 12.0–15.0)
Immature Granulocytes: 0 %
Lymphocytes Relative: 32 %
Lymphs Abs: 1.6 10*3/uL (ref 0.7–4.0)
MCH: 28.3 pg (ref 26.0–34.0)
MCHC: 32.5 g/dL (ref 30.0–36.0)
MCV: 87.1 fL (ref 80.0–100.0)
Monocytes Absolute: 0.4 10*3/uL (ref 0.1–1.0)
Monocytes Relative: 7 %
Neutro Abs: 2.9 10*3/uL (ref 1.7–7.7)
Neutrophils Relative %: 60 %
Platelet Count: 175 10*3/uL (ref 150–400)
RBC: 4.35 MIL/uL (ref 3.87–5.11)
RDW: 16.3 % — ABNORMAL HIGH (ref 11.5–15.5)
WBC Count: 4.9 10*3/uL (ref 4.0–10.5)
nRBC: 0 % (ref 0.0–0.2)

## 2021-01-18 NOTE — Progress Notes (Signed)
Hematology and Oncology Follow Up Visit  [name] [name] [id number] Dec 21, 1960 60 y.o. 01/18/2021 2:08 PM [name], MDSanders, Bailey Mech, MD   Principle Diagnosis: 60 year old woman with IgG MGUS presented with M spike of 1 g/dL and IgG level of 1931 in 2018.   Current therapy: Active surveillance.  Interim History: [name] [name] returns today for repeat evaluation.  Since her last visit, she reports no major changes in her health.  She denies any recent hospitalization or illnesses.  She denies any shortness of breath or difficulty breathing.  She denies any bone pain or pathological fractures.  She does report occasional knee discomfort which were helped by losing weight.  She has intentionally lost weight in the interim.  Medications: Unchanged on review. Current Outpatient Medications  Medication Sig Dispense Refill  . amLODipine (NORVASC) 5 MG tablet Take 1 tablet (5 mg total) by mouth daily. 90 tablet 1  . Calcium Citrate-Vitamin D (CELEBRATE CALCIUM PLUS 500 PO) Take by mouth. 3 chewables daily    . carvedilol (COREG) 12.5 MG tablet TAKE 1 TABLET BY MOUTH TWICE DAILY . APPOINTMENT REQUIRED FOR FUTURE REFILLS 180 tablet 0  . Multiple Vitamins-Minerals (CELEBRATE MULTI-COMPLETE 18) CHEW Chew by mouth. 1 per day    . omeprazole (PRILOSEC) 20 MG capsule     . Probiotic Product (PROBIOTIC DAILY PO) Take 1 tablet by mouth daily.    . ursodiol (ACTIGALL) 250 MG tablet Take 250 mg by mouth 2 (two) times daily.     No current facility-administered medications for this visit.     Allergies: No Known Allergies    . Physical Exam: Blood pressure (!) 158/94, pulse (!) 59, temperature 98.8 F (37.1 C), temperature source Oral, resp. rate 18, weight 199 lb 8 oz (90.5 kg), SpO2 100 %.   ECOG: 0   General appearance: Comfortable appearing without any discomfort Head: Normocephalic without any trauma Oropharynx: Mucous membranes are moist and pink without any thrush or ulcers. Eyes:  Pupils are equal and round reactive to light. Lymph nodes: No cervical, supraclavicular, inguinal or axillary lymphadenopathy.   Heart:regular rate and rhythm.  S1 and S2 without leg edema. Lung: Clear without any rhonchi or wheezes.  No dullness to percussion. Abdomin: Soft, nontender, nondistended with good bowel sounds.  No hepatosplenomegaly. Musculoskeletal: No joint deformity or effusion.  Full range of motion noted. Neurological: No deficits noted on motor, sensory and deep tendon reflex exam. Skin: No petechial rash or dryness.  Appeared moist.      Lab Results: Lab Results  Component Value Date   WBC 3.5 10/05/2020   HGB 12.2 10/05/2020   HCT 38.8 10/05/2020   MCV 89 10/05/2020   PLT 189 10/05/2020     Chemistry      Component Value Date/Time   NA 143 10/05/2020 1447   NA 139 08/22/2017 1254   K 3.8 10/05/2020 1447   K 3.9 08/22/2017 1254   CL 101 10/05/2020 1447   CO2 24 10/05/2020 1447   CO2 31 (H) 08/22/2017 1254   BUN 17 10/05/2020 1447   BUN 15.3 08/22/2017 1254   CREATININE 0.78 10/05/2020 1447   CREATININE 0.86 01/19/2020 1450   CREATININE 0.9 08/22/2017 1254      Component Value Date/Time   CALCIUM 9.3 10/05/2020 1447   CALCIUM 9.3 08/22/2017 1254   ALKPHOS 58 10/05/2020 1447   ALKPHOS 52 08/22/2017 1254   AST 15 10/05/2020 1447   AST 15 01/19/2020 1450   AST 15 08/22/2017 1254   ALT  6 10/05/2020 1447   ALT <6 01/19/2020 1450   ALT 7 08/22/2017 1254   BILITOT 0.3 10/05/2020 1447   BILITOT 0.5 01/19/2020 1450   BILITOT 0.51 08/22/2017 1254      Results for [name] [name] "[name] [name]" (MRN [id number]) as of 01/18/2021 14:10  Ref. Range 08/22/2017 12:54 09/04/2018 12:22  M Protein SerPl Elph-Mcnc Latest Ref Range: Not Observed g/dL 1.0 (H) 1.1 (H)  IFE 1 Unknown Comment Comment  Globulin, Total Latest Ref Range: 2.2 - 3.9 g/dL 4.0 (H) 3.7  B-Globulin SerPl Elph-Mcnc Latest Ref Range: 0.7 - 1.3 g/dL 1.0 0.9  IgG (Immunoglobin G), Serum Latest  Ref Range: 700 - 1,600 mg/dL 1,931 (H) 1,812 (H)  IgM (Immunoglobulin M), Srm Latest Ref Range: 26 - 217 mg/dL  80  IgM, Qn, Serum Latest Ref Range: 26 - 217 mg/dL 94   IgA Latest Ref Range: 87 - 352 mg/dL  98      Impression and Plan:  60 year old woman with:   1.  IgG kappa MGUS diagnosed in 2018 without any evidence of symptomatic multiple myeloma.  He has been on active surveillance since that time.  The natural course of her disease was discussed at this time.  Laboratory data since 2019 were reviewed and continues to show mild elevation in her IgG level and an M spike of around 1 g/dL.  Laboratory data from October 05, 2020 showed normal creatinine, calcium, albumin and total protein.  Hemoglobin was 12.2 otherwise normal CBC.  At this time, I see no evidence to suggest symptomatic multiple myeloma and I recommended continued active surveillance.  We will update her protein studies today and repeat annually.  The risk of progression into multiple myeloma was discussed which is estimated about 1% per year.  Her CBC from today also within normal range which supports the likelihood of myeloma transformation.  2.  Follow-up: In 1 year for repeat evaluation.  30  minutes were spent on this encounter.  The time was dedicated to reviewing laboratory data, disease status update and the complications associated with her condition.    Zola Button, MD 6/2/20222:08 PM

## 2021-01-19 LAB — KAPPA/LAMBDA LIGHT CHAINS
Kappa free light chain: 16.3 mg/L (ref 3.3–19.4)
Kappa, lambda light chain ratio: 1.35 (ref 0.26–1.65)
Lambda free light chains: 12.1 mg/L (ref 5.7–26.3)

## 2021-01-21 ENCOUNTER — Encounter: Payer: Self-pay | Admitting: Oncology

## 2021-01-21 LAB — MULTIPLE MYELOMA PANEL, SERUM
Albumin SerPl Elph-Mcnc: 4 g/dL (ref 2.9–4.4)
Albumin/Glob SerPl: 1.1 (ref 0.7–1.7)
Alpha 1: 0.2 g/dL (ref 0.0–0.4)
Alpha2 Glob SerPl Elph-Mcnc: 0.7 g/dL (ref 0.4–1.0)
B-Globulin SerPl Elph-Mcnc: 0.8 g/dL (ref 0.7–1.3)
Gamma Glob SerPl Elph-Mcnc: 1.9 g/dL — ABNORMAL HIGH (ref 0.4–1.8)
Globulin, Total: 3.7 g/dL (ref 2.2–3.9)
IgA: 132 mg/dL (ref 87–352)
IgG (Immunoglobin G), Serum: 1723 mg/dL — ABNORMAL HIGH (ref 586–1602)
IgM (Immunoglobulin M), Srm: 98 mg/dL (ref 26–217)
M Protein SerPl Elph-Mcnc: 1 g/dL — ABNORMAL HIGH
Total Protein ELP: 7.7 g/dL (ref 6.0–8.5)

## 2021-01-23 ENCOUNTER — Other Ambulatory Visit: Payer: Self-pay

## 2021-01-23 ENCOUNTER — Ambulatory Visit: Payer: BC Managed Care – PPO

## 2021-01-23 VITALS — BP 132/78 | HR 85 | Temp 98.5°F | Ht 67.0 in | Wt 197.0 lb

## 2021-01-23 DIAGNOSIS — E538 Deficiency of other specified B group vitamins: Secondary | ICD-10-CM

## 2021-01-23 MED ORDER — CYANOCOBALAMIN 1000 MCG/ML IJ SOLN
1000.0000 ug | Freq: Once | INTRAMUSCULAR | Status: AC
  Administered 2021-02-27: 1000 ug via INTRAMUSCULAR

## 2021-01-23 NOTE — Progress Notes (Signed)
Pt is here for b12  injection today.

## 2021-02-20 ENCOUNTER — Ambulatory Visit: Payer: BC Managed Care – PPO

## 2021-02-27 ENCOUNTER — Ambulatory Visit (INDEPENDENT_AMBULATORY_CARE_PROVIDER_SITE_OTHER): Payer: BC Managed Care – PPO

## 2021-02-27 ENCOUNTER — Other Ambulatory Visit: Payer: Self-pay

## 2021-02-27 VITALS — BP 118/80 | HR 64 | Temp 98.3°F | Ht 67.0 in | Wt 190.4 lb

## 2021-02-27 DIAGNOSIS — E538 Deficiency of other specified B group vitamins: Secondary | ICD-10-CM | POA: Diagnosis not present

## 2021-02-27 MED ORDER — CYANOCOBALAMIN 1000 MCG/ML IJ SOLN
1000.0000 ug | Freq: Once | INTRAMUSCULAR | Status: AC
  Administered 2021-02-27: 1000 ug via INTRAMUSCULAR

## 2021-02-27 NOTE — Progress Notes (Signed)
Pt is here for b12 injection. 

## 2021-02-28 DIAGNOSIS — Z9884 Bariatric surgery status: Secondary | ICD-10-CM | POA: Diagnosis not present

## 2021-03-06 ENCOUNTER — Ambulatory Visit: Payer: BC Managed Care – PPO

## 2021-03-19 ENCOUNTER — Other Ambulatory Visit: Payer: Self-pay | Admitting: Internal Medicine

## 2021-03-27 ENCOUNTER — Ambulatory Visit: Payer: BC Managed Care – PPO

## 2021-04-04 ENCOUNTER — Other Ambulatory Visit: Payer: Self-pay

## 2021-04-04 ENCOUNTER — Ambulatory Visit (INDEPENDENT_AMBULATORY_CARE_PROVIDER_SITE_OTHER): Payer: BC Managed Care – PPO | Admitting: Internal Medicine

## 2021-04-04 ENCOUNTER — Encounter: Payer: Self-pay | Admitting: Internal Medicine

## 2021-04-04 VITALS — BP 130/78 | HR 71 | Temp 98.5°F | Ht 67.0 in | Wt 189.9 lb

## 2021-04-04 DIAGNOSIS — E663 Overweight: Secondary | ICD-10-CM

## 2021-04-04 DIAGNOSIS — R7309 Other abnormal glucose: Secondary | ICD-10-CM | POA: Diagnosis not present

## 2021-04-04 DIAGNOSIS — Z79899 Other long term (current) drug therapy: Secondary | ICD-10-CM | POA: Diagnosis not present

## 2021-04-04 DIAGNOSIS — E538 Deficiency of other specified B group vitamins: Secondary | ICD-10-CM | POA: Diagnosis not present

## 2021-04-04 DIAGNOSIS — R413 Other amnesia: Secondary | ICD-10-CM | POA: Diagnosis not present

## 2021-04-04 DIAGNOSIS — R350 Frequency of micturition: Secondary | ICD-10-CM | POA: Diagnosis not present

## 2021-04-04 DIAGNOSIS — I10 Essential (primary) hypertension: Secondary | ICD-10-CM | POA: Diagnosis not present

## 2021-04-04 LAB — POCT URINALYSIS DIPSTICK
Bilirubin, UA: NEGATIVE
Blood, UA: NEGATIVE
Glucose, UA: NEGATIVE
Ketones, UA: NEGATIVE
Leukocytes, UA: NEGATIVE
Nitrite, UA: NEGATIVE
Protein, UA: NEGATIVE
Spec Grav, UA: 1.02 (ref 1.010–1.025)
Urobilinogen, UA: 0.2 E.U./dL
pH, UA: 5.5 (ref 5.0–8.0)

## 2021-04-04 MED ORDER — CYANOCOBALAMIN 1000 MCG/ML IJ SOLN
1000.0000 ug | Freq: Once | INTRAMUSCULAR | Status: AC
  Administered 2021-04-04: 1000 ug via INTRAMUSCULAR

## 2021-04-04 NOTE — Progress Notes (Signed)
I,Tianna Badgett,acting as a Education administrator for Maximino Greenland, MD.,have documented all relevant documentation on the behalf of Maximino Greenland, MD,as directed by  Maximino Greenland, MD while in the presence of Maximino Greenland, MD.  This visit occurred during the SARS-CoV-2 public health emergency.  Safety protocols were in place, including screening questions prior to the visit, additional usage of staff PPE, and extensive cleaning of exam room while observing appropriate contact time as indicated for disinfecting solutions.  Subjective:     Patient ID: [name] [name] , female    DOB: 1961-05-20 , 60 y.o.   MRN: [id number]   Chief Complaint  Patient presents with   Hypertension    HPI  She is here today for a follow-up on her blood pressure.  She reports compliance with meds. She denies headaches, chest pain and shortness of breath.   Hypertension This is a chronic problem. The current episode started more than 1 year ago. The problem has been gradually improving since onset. The problem is controlled. Pertinent negatives include no blurred vision, chest pain, palpitations or shortness of breath. Risk factors for coronary artery disease include obesity, post-menopausal state and sedentary lifestyle. The current treatment provides moderate improvement. Compliance problems include exercise.     Past Medical History:  Diagnosis Date   Abnormal glucose    Candidiasis    Cervicalgia    Essential hypertension 12/07/2016   Flatulence    Hypertension    Localized swelling, mass and lump, head    Lumbago    Morbid obesity ([place]) 12/07/2016   Obesity 12/07/2016   Onychomycosis      Family History  Problem Relation Age of Onset   Hypertension Mother    Heart failure Father    Diabetes Father    Heart attack Father    Breast cancer Neg Hx      Current Outpatient Medications:    amLODipine (NORVASC) 5 MG tablet, Take 1 tablet (5 mg total) by mouth daily., Disp: 90 tablet, Rfl: 1   Calcium  Citrate-Vitamin D (CELEBRATE CALCIUM PLUS 500 PO), Take by mouth. 3 chewables daily, Disp: , Rfl:    carvedilol (COREG) 12.5 MG tablet, TAKE 1 TABLET BY MOUTH TWICE DAILY(APPT REQUIRED FOR FUTURE REFILLS), Disp: 180 tablet, Rfl: 0   Multiple Vitamins-Minerals (CELEBRATE MULTI-COMPLETE 18) CHEW, Chew by mouth. 1 per day, Disp: , Rfl:    No Known Allergies   Review of Systems  Constitutional: Negative.   Eyes:  Negative for blurred vision.  Respiratory: Negative.  Negative for shortness of breath.   Cardiovascular: Negative.  Negative for chest pain and palpitations.  Gastrointestinal: Negative.   Genitourinary:  Positive for frequency and urgency.  Neurological:        She c/o issues with her memory. Unable to determine what is contributing to her sx.     Today's Vitals   04/04/21 1125  BP: 130/78  Pulse: 71  Temp: 98.5 F (36.9 C)  TempSrc: Oral  Weight: 189 lb 14.4 oz (86.1 kg)  Height: '5\' 7"'$  (1.702 m)   Body mass index is 29.74 kg/m.  Wt Readings from Last 3 Encounters:  04/04/21 189 lb 14.4 oz (86.1 kg)  02/27/21 190 lb 6.4 oz (86.4 kg)  01/23/21 197 lb (89.4 kg)    Objective:  Physical Exam Vitals and nursing note reviewed.  Constitutional:      Appearance: Normal appearance.  HENT:     Head: Normocephalic and atraumatic.     Nose:  Comments: Masked     Mouth/Throat:     Comments: Masked  Cardiovascular:     Rate and Rhythm: Normal rate and regular rhythm.     Heart sounds: Normal heart sounds.  Pulmonary:     Effort: Pulmonary effort is normal.     Breath sounds: Normal breath sounds.  Musculoskeletal:     Cervical back: Normal range of motion.  Skin:    General: Skin is warm.  Neurological:     General: No focal deficit present.     Mental Status: She is alert.  Psychiatric:        Mood and Affect: Mood normal.        Behavior: Behavior normal.        Assessment And Plan:     1. Essential hypertension Comments: Chronic, controlled.  I will  not make any med changes today. Encouraged to follow low sodium diet.  - Insulin, random(561)  2. B12 deficiency Comments: I will check vitamin B12 level today. She was also given vitamin B12 IM x 1.  - cyanocobalamin ((VITAMIN B-12)) injection 1,000 mcg  3. Memory deficits Comments: I will check labs as listed below. Encouraged to avoid multi-tasking - this can affect focus and may affect cognition.  - Vitamin B12 - TSH - RPR  4. Frequency of urination Comments: I will check urinalysis to r/o UTI.  - POCT Urinalysis Dipstick (81002)  5. Overweight (BMI 25.0-29.9) Comments: She is s/p bariatric surgery Dec 2021. She has lost another 8 pounds since June 2022.   Patient was given opportunity to ask questions. Patient verbalized understanding of the plan and was able to repeat key elements of the plan. All questions were answered to their satisfaction.   I, Maximino Greenland, MD, have reviewed all documentation for this visit. The documentation on 04/08/21 for the exam, diagnosis, procedures, and orders are all accurate and complete.   IF YOU HAVE BEEN REFERRED TO A SPECIALIST, IT MAY TAKE 1-2 WEEKS TO SCHEDULE/PROCESS THE REFERRAL. IF YOU HAVE NOT HEARD FROM US/SPECIALIST IN TWO WEEKS, PLEASE GIVE [place] A CALL AT [phone].   THE PATIENT IS ENCOURAGED TO PRACTICE SOCIAL DISTANCING DUE TO THE COVID-19 PANDEMIC.

## 2021-04-04 NOTE — Patient Instructions (Signed)

## 2021-04-05 LAB — RPR: RPR Ser Ql: NONREACTIVE

## 2021-04-05 LAB — TSH: TSH: 1.28 u[IU]/mL (ref 0.450–4.500)

## 2021-04-05 LAB — INSULIN, RANDOM: INSULIN: 12.2 u[IU]/mL (ref 2.6–24.9)

## 2021-04-05 LAB — VITAMIN B12: Vitamin B-12: 532 pg/mL (ref 232–1245)

## 2021-04-11 ENCOUNTER — Telehealth: Payer: Self-pay

## 2021-04-11 NOTE — Telephone Encounter (Signed)
LVMT Md office requesting call back with referral to PREP for pt. Pt expressed interest in participating in the 12 wk exercise program at [facility]

## 2021-04-18 NOTE — Progress Notes (Signed)
[facility] PREP Weekly Session   Patient Details  Name: [name] [name] MRN: [id number] Date of Birth: 04/06/1961 Age: 60 y.o. PCP: [name], MD  Vitals:   04/10/21 1830  Weight: 185 lb (83.9 kg)     [facility] Weekly seesion - 04/18/21 1400       Weekly Session   Topic Discussed Importance of resistance training;Other ways to be active    Minutes exercised this week 210 minutes    Classes attended to date 3            Class held at [facility] 04/18/2021, 2:24 PM

## 2021-04-19 ENCOUNTER — Other Ambulatory Visit (HOSPITAL_BASED_OUTPATIENT_CLINIC_OR_DEPARTMENT_OTHER): Payer: Self-pay | Admitting: *Deleted

## 2021-04-19 DIAGNOSIS — I10 Essential (primary) hypertension: Secondary | ICD-10-CM

## 2021-04-25 NOTE — Progress Notes (Signed)
[facility] PREP Weekly Session   Patient Details  Name: [name] [name] MRN: [id number] Date of Birth: Feb 25, 1961 Age: 60 y.o. PCP: [name], MD  Vitals:   04/24/21 1830  Weight: 186 lb (84.4 kg)     [facility] Weekly seesion - 04/25/21 0900       Weekly Session   Topic Discussed Healthy eating tips   sugar demo   Minutes exercised this week 120 minutes    Classes attended to date 5            Class held at [facility] 04/25/2021, 9:40 AM

## 2021-05-02 NOTE — Progress Notes (Signed)
[facility] PREP Weekly Session   Patient Details  Name: [name] [name] MRN: [id number] Date of Birth: Jul 14, 1961 Age: 60 y.o. PCP: [name], MD  Vitals:   05/01/21 1830  Weight: 187 lb (84.8 kg)     [facility] Weekly seesion - 05/02/21 0900       Weekly Session   Topic Discussed Health habits    Minutes exercised this week 115 minutes    Classes attended to date 7            Class held at [facility] 05/02/2021, 9:46 AM

## 2021-05-21 NOTE — Progress Notes (Signed)
[facility] PREP Weekly Session  Patient Details  Name: Nickol Collister MRN: [id number] Date of Birth: 10/24/1960 Age: 60 y.o. PCP: [name], MD  Vitals:   05/15/21 1830  Weight: 189 lb 9.6 oz (86 kg)     [facility] Weekly seesion - 05/21/21 1000       [facility] "PREP" Location   [facility] "PREP" Location [facility]      Weekly Session   Topic Discussed Restaurant Eating   Salt demo, meditation, chair yoga   Minutes exercised this week 160 minutes    Classes attended to date 10           Late entry from 05/15/21  Barnett Hatter 05/21/2021, 10:20 AM

## 2021-05-23 NOTE — Progress Notes (Signed)
[facility] PREP Weekly Session  Patient Details  Name: [name] [name] MRN: [id number] Date of Birth: 1961-08-06 Age: 60 y.o. PCP: [name], MD  Vitals:   05/22/21 1830  Weight: 189 lb 3.2 oz (85.8 kg)     [facility] Weekly seesion - 05/23/21 0900       [facility] "PREP" Location   [facility] "PREP" Location [facility]      Weekly Session   Topic Discussed Stress management and problem solving   materials on improving sleep sent via email   Minutes exercised this week 160 minutes    Classes attended to date 11            Class held on 05/22/21  Barnett Hatter 05/23/2021, 9:56 AM

## 2021-05-30 NOTE — Progress Notes (Signed)
[facility] PREP Weekly Session  Patient Details  Name: Rhianna Raulerson MRN: [id number] Date of Birth: [birth year] Age: 60 y.o. PCP: [name], MD  Vitals:   05/29/21 0935  Weight: 185 lb (83.9 kg)     [facility] Weekly seesion - 05/30/21 0900       [facility] "PREP" Location   [facility] "PREP" [facility]      Weekly Session   Topic Discussed Expectations and non-scale victories    Minutes exercised this week 360 minutes    Classes attended to date 13           Class held on 05-29-21  Barnett Hatter 05/30/2021, 9:36 AM

## 2021-06-06 NOTE — Progress Notes (Unsigned)
[facility] PREP Weekly Session  Patient Details  Name: Donis Kotowski MRN: [id number] Date of Birth: 12/08/1960 Age: 60 y.o. PCP: [name], MD  There were no vitals filed for this visit.   [facility] Weekly seesion - 06/06/21 1100       [facility] "PREP" Location   [facility] "PREP" Location [facility]      Weekly Session   Topic Discussed Other   portion control   Minutes exercised this week 280 minutes    Classes attended to date 14           Class held on 06/05/21  Barnett Hatter 06/06/2021, 11:07 AM

## 2021-06-06 NOTE — Progress Notes (Signed)
[facility] PREP Weekly Session  Patient Details  Name: [name] [name] MRN: [id number] Date of Birth: June 01, 1961 Age: 60 y.o. PCP: [name], MD  Vitals:   06/05/21 1830  Weight: 188 lb (85.3 kg)     [facility] Weekly seesion - 06/06/21 1100       [facility] "PREP" Location   [facility] "PREP" Location [facility]      Weekly Session   Topic Discussed Other   Portion control   Minutes exercised this week 280 minutes    Classes attended to date 14           Class held on 06/05/21   Barnett Hatter 06/06/2021, 11:10 AM

## 2021-06-13 NOTE — Progress Notes (Signed)
[facility] PREP Weekly Session  Patient Details  Name: [name] [name] MRN: [id number] Date of Birth: 26-Aug-1960 Age: 60 y.o. PCP: [name], MD  Vitals:   06/12/21 1830  Weight: 190 lb 3.2 oz (86.3 kg)     [facility] Weekly seesion - 06/13/21 0900       [facility] "PREP" Location   [facility] "PREP" Location [facility]      Weekly Session   Topic Discussed Finding support    Minutes exercised this week 220 minutes    Classes attended to date 17           Class held on 06/12/21  Barnett Hatter 06/13/2021, 9:35 AM

## 2021-06-19 ENCOUNTER — Other Ambulatory Visit: Payer: Self-pay

## 2021-06-19 MED ORDER — AMLODIPINE BESYLATE 5 MG PO TABS: 5.0000 mg | ORAL_TABLET | Freq: Every day | ORAL | 1 refills | Status: DC

## 2021-06-20 NOTE — Progress Notes (Signed)
[facility] PREP Weekly Session  Patient Details  Name: [name] [name] MRN: [id number] Date of Birth: 1961/02/11 Age: 60 y.o. PCP: [name], MD  Vitals:   06/19/21 1830  Weight: 187 lb 12.8 oz (85.2 kg)     [facility] Weekly seesion - 06/20/21 1000       [facility] "PREP" Location   [facility] "PREP" Location [facility]      Weekly Session   Topic Discussed Calorie breakdown    Minutes exercised this week 140 minutes    Classes attended to date 19            Class held on 06/19/21   Barnett Hatter 06/20/2021, 10:00 AM

## 2021-06-27 NOTE — Progress Notes (Signed)
[facility] PREP Weekly Session  Patient Details  Name: [name] [name] MRN: [id number] Date of Birth: 1961-06-08 Age: 60 y.o. PCP: [name], MD  Vitals:   06/26/21 1830  Weight: 189 lb 12.8 oz (86.1 kg)     [facility] Weekly seesion - 06/27/21 1000       [facility] "PREP" Location   [facility] "PREP" Location [facility]      Weekly Session   Topic Discussed Hitting roadblocks    Minutes exercised this week 210 minutes    Classes attended to date 21           Class held on 06/26/21  Barnett Hatter 06/27/2021, 10:02 AM

## 2021-06-29 ENCOUNTER — Other Ambulatory Visit: Payer: Self-pay | Admitting: Internal Medicine

## 2021-07-04 ENCOUNTER — Ambulatory Visit: Payer: BC Managed Care – PPO | Admitting: Internal Medicine

## 2021-07-04 ENCOUNTER — Encounter: Payer: Self-pay | Admitting: Internal Medicine

## 2021-07-04 ENCOUNTER — Other Ambulatory Visit: Payer: Self-pay

## 2021-07-04 VITALS — BP 130/80 | HR 58 | Temp 97.9°F | Ht 66.8 in | Wt 189.4 lb

## 2021-07-04 DIAGNOSIS — M542 Cervicalgia: Secondary | ICD-10-CM | POA: Diagnosis not present

## 2021-07-04 DIAGNOSIS — I10 Essential (primary) hypertension: Secondary | ICD-10-CM

## 2021-07-04 DIAGNOSIS — D472 Monoclonal gammopathy: Secondary | ICD-10-CM | POA: Diagnosis not present

## 2021-07-04 MED ORDER — CARVEDILOL 12.5 MG PO TABS
ORAL_TABLET | ORAL | 1 refills | Status: DC
Start: 2021-07-04 — End: 2022-04-15

## 2021-07-04 NOTE — Patient Instructions (Signed)

## 2021-07-04 NOTE — Progress Notes (Signed)
[name] [name],acting as a Education administrator for Maximino Greenland, MD.,have documented all relevant documentation on the behalf of Maximino Greenland, MD,as directed by  Maximino Greenland, MD while in the presence of Maximino Greenland, MD.  This visit occurred during the SARS-CoV-2 public health emergency.  Safety protocols were in place, including screening questions prior to the visit, additional usage of staff PPE, and extensive cleaning of exam room while observing appropriate contact time as indicated for disinfecting solutions.  Subjective:     Patient ID: [name] [name] , female    DOB: February 08, 1961 , 60 y.o.   MRN: [id number]   Chief Complaint  Patient presents with   Hypertension    HPI  She is here today for a follow-up on her blood pressure.  She reports compliance with meds. She denies headaches, chest pain and shortness of breath.   Hypertension This is a chronic problem. The current episode started more than 1 year ago. The problem has been gradually improving since onset. The problem is controlled. Associated symptoms include neck pain. Pertinent negatives include no blurred vision, chest pain, palpitations or shortness of breath. Risk factors for coronary artery disease include obesity, post-menopausal state and sedentary lifestyle. The current treatment provides moderate improvement. Compliance problems include exercise.     Past Medical History:  Diagnosis Date   Abnormal glucose    Candidiasis    Cervicalgia    Essential hypertension 12/07/2016   Flatulence    Hypertension    Localized swelling, mass and lump, head    Lumbago    Morbid obesity ([place]) 12/07/2016   Obesity 12/07/2016   Onychomycosis      Family History  Problem Relation Age of Onset   Hypertension Mother    Heart failure Father    Diabetes Father    Heart attack Father    Breast cancer Neg Hx      Current Outpatient Medications:    amLODipine (NORVASC) 5 MG tablet, Take 1 tablet (5 mg total) by mouth  daily., Disp: 90 tablet, Rfl: 1   Calcium Citrate-Vitamin D (CELEBRATE CALCIUM PLUS 500 PO), Take by mouth. 3 chewables daily, Disp: , Rfl:    Multiple Vitamins-Minerals (CELEBRATE MULTI-COMPLETE 18) CHEW, Chew by mouth. 1 per day, Disp: , Rfl:    TURMERIC CURCUMIN PO, Take 1 capsule by mouth daily., Disp: , Rfl:    carvedilol (COREG) 12.5 MG tablet, TAKE 1 TABLET BY MOUTH TWICE DAILY(APPT REQUIRED FOR FUTURE REFILLS), Disp: 180 tablet, Rfl: 1   No Known Allergies   Review of Systems  Constitutional: Negative.   Eyes:  Negative for blurred vision.  Respiratory: Negative.  Negative for shortness of breath.   Cardiovascular: Negative.  Negative for chest pain and palpitations.  Musculoskeletal:  Positive for neck pain.       C/o neck pain. Denies fall/trauma. States this area feels tight. Denies UE weakness/paresthesias.   Neurological: Negative.   Psychiatric/Behavioral: Negative.      Today's Vitals   07/04/21 1529  BP: 130/80  Pulse: (!) 58  Temp: 97.9 F (36.6 C)  Weight: 189 lb 6.4 oz (85.9 kg)  Height: 5' 6.8" (1.697 m)  PainSc: 0-No pain   Body mass index is 29.84 kg/m.  Wt Readings from Last 3 Encounters:  07/04/21 189 lb 6.4 oz (85.9 kg)  06/26/21 189 lb 12.8 oz (86.1 kg)  06/19/21 187 lb 12.8 oz (85.2 kg)     Objective:  Physical Exam Vitals and nursing note reviewed.  Constitutional:  Appearance: Normal appearance.  HENT:     Head: Normocephalic and atraumatic.     Nose:     Comments: Masked     Mouth/Throat:     Comments: Masked  Eyes:     Extraocular Movements: Extraocular movements intact.  Cardiovascular:     Rate and Rhythm: Normal rate and regular rhythm.     Heart sounds: Normal heart sounds.  Pulmonary:     Effort: Pulmonary effort is normal.     Breath sounds: Normal breath sounds.  Musculoskeletal:     Cervical back: Normal range of motion. Tenderness present. No erythema or rigidity. Muscular tenderness present. No pain with movement.   Skin:    General: Skin is warm.  Neurological:     General: No focal deficit present.     Mental Status: She is alert.  Psychiatric:        Mood and Affect: Mood normal.        Behavior: Behavior normal.        Assessment And Plan:     1. Essential hypertension Comments: Chronic, controlled. NO med changes. I will check renal function today. She will rto in Feb 2023 for her next physical examination.  - BMP8+EGFR  2. Monoclonal gammopathy of unknown significance (MGUS) Comments: Chronic, stable. I appreciate Hematology input.   3. Cervicalgia Comments: I think her sx are musculoskeletal in nature. Advised to apply Vicks to affected area tonight. I will also [facility] cervical [facility].  We also discussed importance of proper posture while typing/working at computer. May also benefit from Mg supplementation.  - Ambulatory referral to Chiropractic  Patient was given opportunity to ask questions. Patient verbalized understanding of the plan and was able to repeat key elements of the plan. All questions were answered to their satisfaction.   I, Maximino Greenland, MD, have reviewed all documentation for this visit. The documentation on 07/04/21 for the exam, diagnosis, procedures, and orders are all accurate and complete.   IF YOU HAVE BEEN REFERRED TO A SPECIALIST, IT MAY TAKE 1-2 WEEKS TO SCHEDULE/PROCESS THE REFERRAL. IF YOU HAVE NOT HEARD FROM US/SPECIALIST IN TWO WEEKS, PLEASE GIVE [place] A CALL AT [phone].   THE PATIENT IS ENCOURAGED TO PRACTICE SOCIAL DISTANCING DUE TO THE COVID-19 PANDEMIC.

## 2021-07-05 LAB — BMP8+EGFR
BUN/Creatinine Ratio: 19 (ref 9–23)
BUN: 15 mg/dL (ref 6–24)
CO2: 29 mmol/L (ref 20–29)
Calcium: 9.3 mg/dL (ref 8.7–10.2)
Chloride: 101 mmol/L (ref 96–106)
Creatinine, Ser: 0.77 mg/dL (ref 0.57–1.00)
Glucose: 64 mg/dL — ABNORMAL LOW (ref 70–99)
Potassium: 4 mmol/L (ref 3.5–5.2)
Sodium: 142 mmol/L (ref 134–144)
eGFR: 89 mL/min/{1.73_m2} (ref >59)

## 2021-07-09 NOTE — Progress Notes (Signed)
[facility] PREP Evaluation  Patient Details  Name: [name] [name] MRN: [id number] Date of Birth: 08-04-1961 Age: 60 y.o. PCP: [name], MD  Vitals:   07/05/21 1900  BP: 122/70  Pulse: 60  SpO2: 97%  Weight: 185 lb 12.8 oz (84.3 kg)     [facility] Eval - 07/09/21 1100       [facility] "PREP" Location   [facility] "PREP" Location [facility]      Referral    Referring Provider HTN clinic    Program Start Date --   Final class date 07/05/21     Measurement   Waist Circumference 37 inches    Hip Circumference 42.5 inches    Body fat 34.3 percent      Mobility and Daily Activities   I find it easy to walk up or down two or more flights of stairs. 3    I have no trouble taking out the trash. 4    I do housework such as vacuuming and dusting on my own without difficulty. 3    I can easily lift a gallon of milk (8lbs). 4    I can easily walk a mile. 3    I have no trouble reaching into high cupboards or reaching down to pick up something from the floor. 4    I do not have trouble doing out-door work such as Armed forces logistics/support/administrative officer, raking leaves, or gardening. 4      Mobility and Daily Activities   I feel younger than my age. 3    I feel independent. 4    I feel energetic. 3    I live an active life.  3    I feel strong. 3    I feel healthy. 3    I feel active as other people my age. 3      How fit and strong are you.   Fit and Strong Total Score 47            Past Medical History:  Diagnosis Date   Abnormal glucose    Candidiasis    Cervicalgia    Essential hypertension 12/07/2016   Flatulence    Hypertension    Localized swelling, mass and lump, head    Lumbago    Morbid obesity ([place]) 12/07/2016   Obesity 12/07/2016   Onychomycosis    Past Surgical History:  Procedure Laterality Date   ABDOMINAL HYSTERECTOMY     LAPAROSCOPIC GASTRIC SLEEVE RESECTION  08/08/2020   Social History   Tobacco Use  Smoking Status Never  Smokeless Tobacco Never  Final class day:  07/05/21 Time testing: Cardio march: 270 to 320 Sit to stand: 22 to 25 Right bicep curl: 27 to 29 Improvement in single leg balance tests Attended all educational and workout sessions   Barnett Hatter 07/09/2021, 11:39 AM

## 2021-07-16 DIAGNOSIS — M9901 Segmental and somatic dysfunction of cervical region: Secondary | ICD-10-CM | POA: Diagnosis not present

## 2021-07-16 DIAGNOSIS — M9904 Segmental and somatic dysfunction of sacral region: Secondary | ICD-10-CM | POA: Diagnosis not present

## 2021-07-16 DIAGNOSIS — M9903 Segmental and somatic dysfunction of lumbar region: Secondary | ICD-10-CM | POA: Diagnosis not present

## 2021-07-16 DIAGNOSIS — M9905 Segmental and somatic dysfunction of pelvic region: Secondary | ICD-10-CM | POA: Diagnosis not present

## 2021-07-24 IMAGING — MG MM DIGITAL SCREENING BILAT W/ TOMO AND CAD
8 series · 8 of 24 positions shown · non-contrast
Comparison: Previous exam(s).

CLINICAL DATA: Screening.

EXAM:
DIGITAL SCREENING BILATERAL MAMMOGRAM WITH TOMOSYNTHESIS AND CAD
TECHNIQUE: Bilateral screening digital craniocaudal and mediolateral oblique
mammograms were obtained. Bilateral screening digital breast
tomosynthesis was performed. The images were evaluated with
computer-aided detection.

[L CC synth-2D]
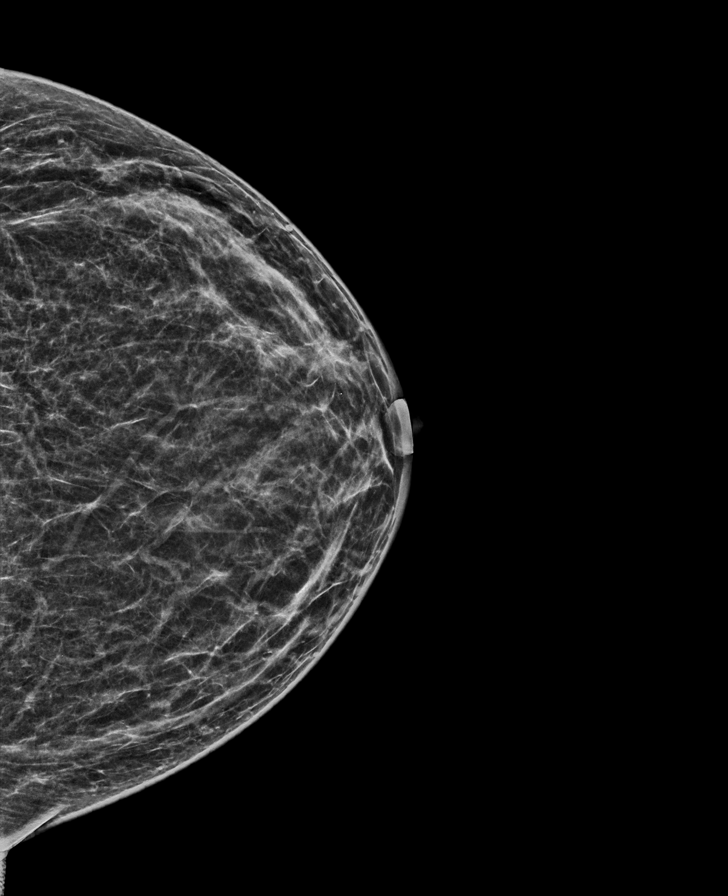

[R MLO synth-2D]
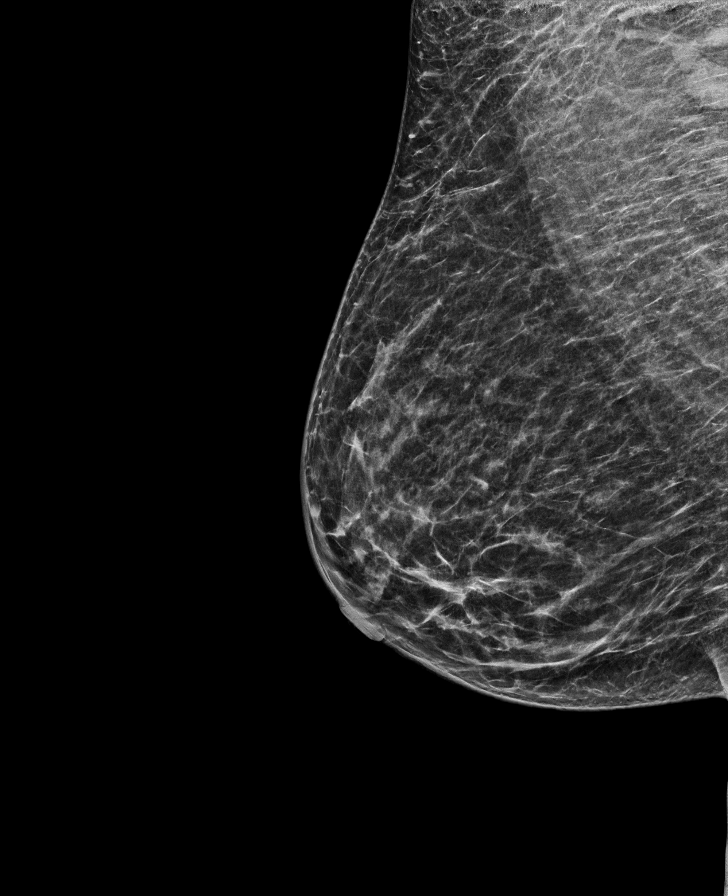

[R CC synth-2D]
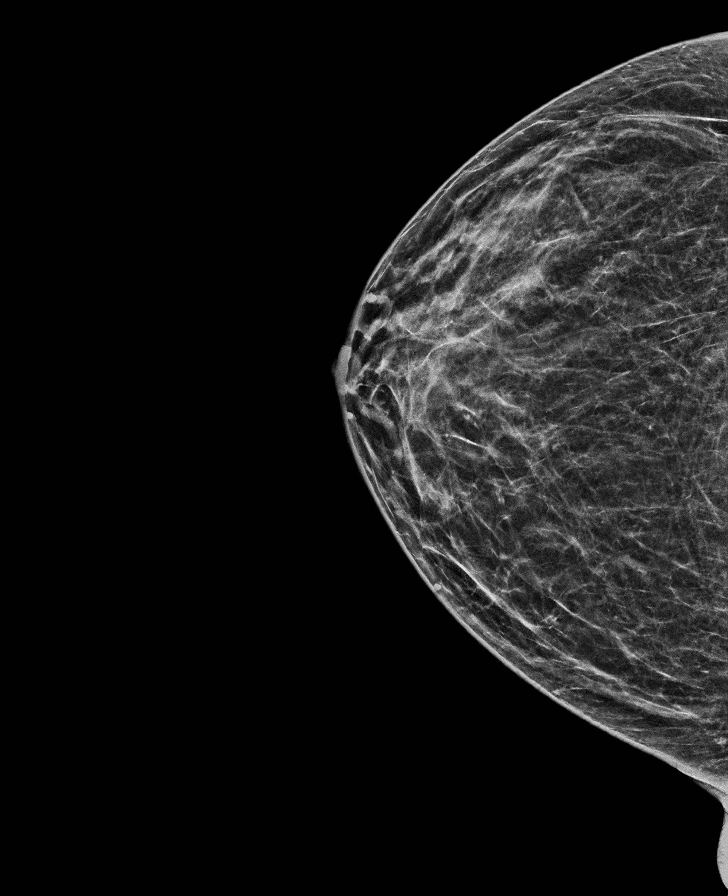

[L MLO synth-2D]
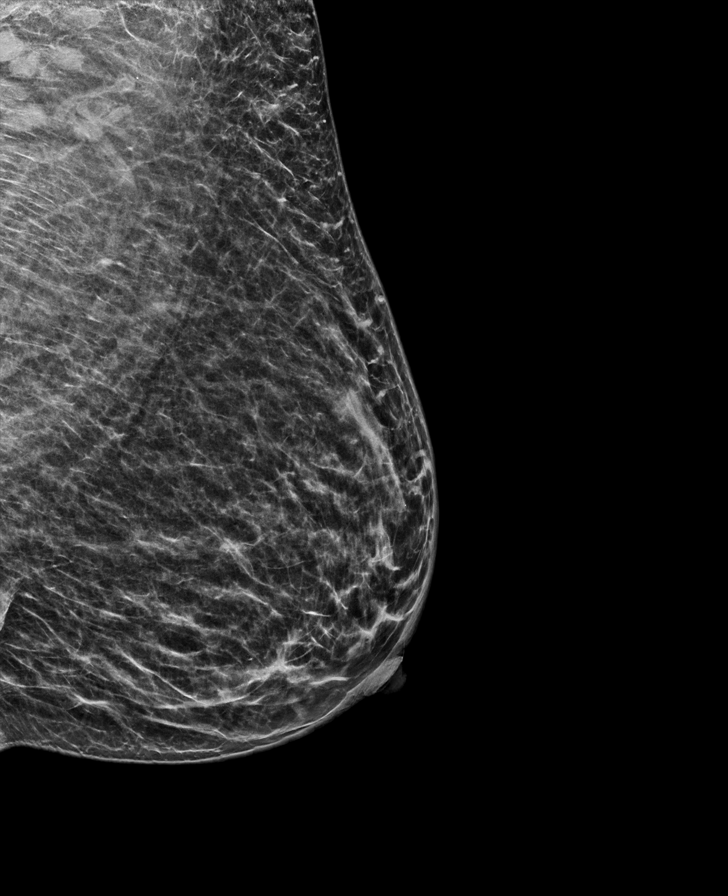

[R MLO tomo · tomo slice 27/54.0]
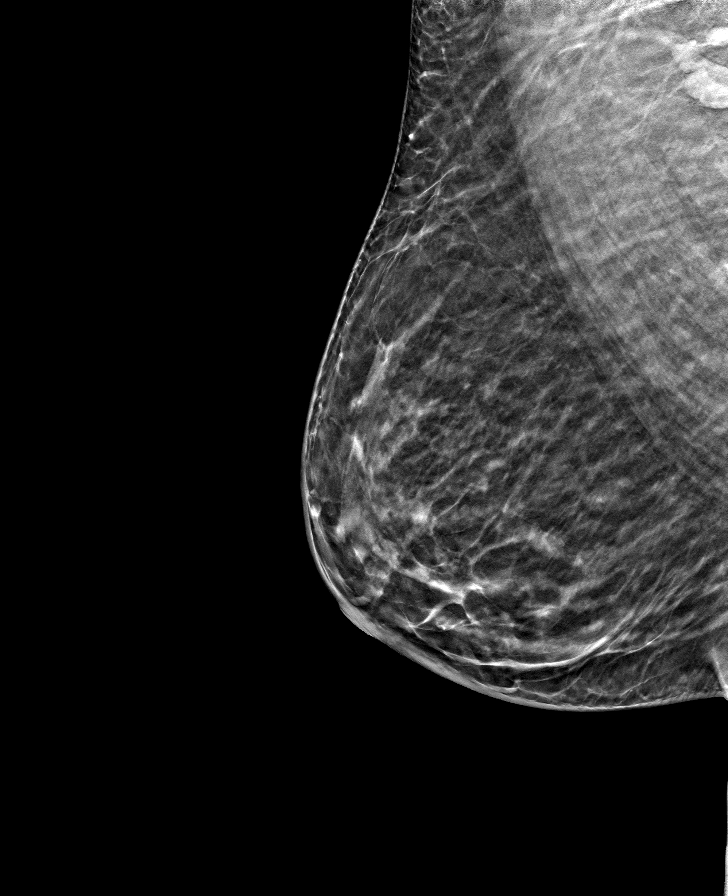

[L CC tomo · tomo slice 25/50.0]
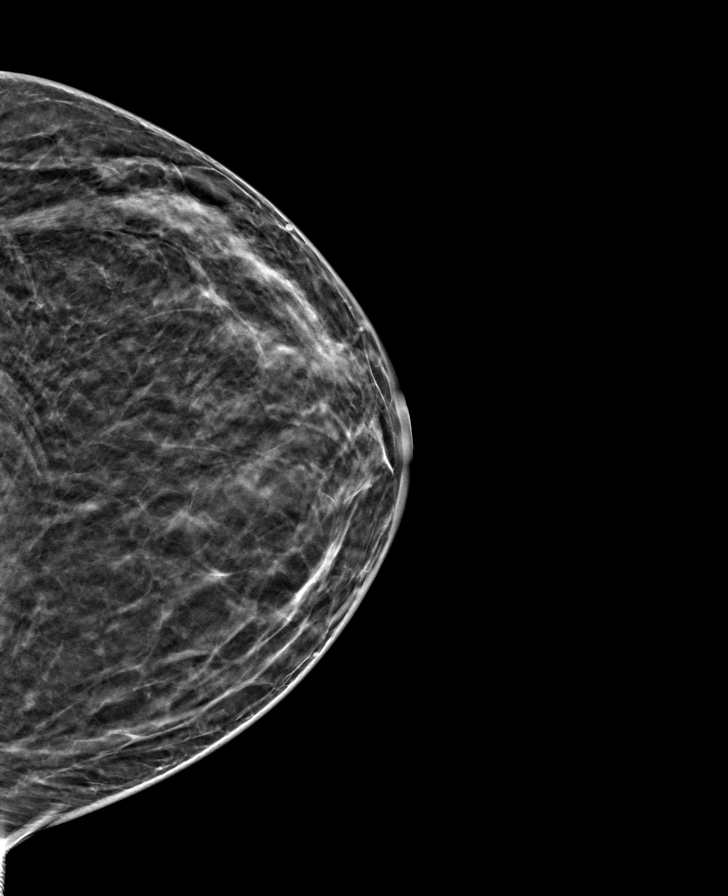

[R CC tomo · tomo slice 25/49.0]
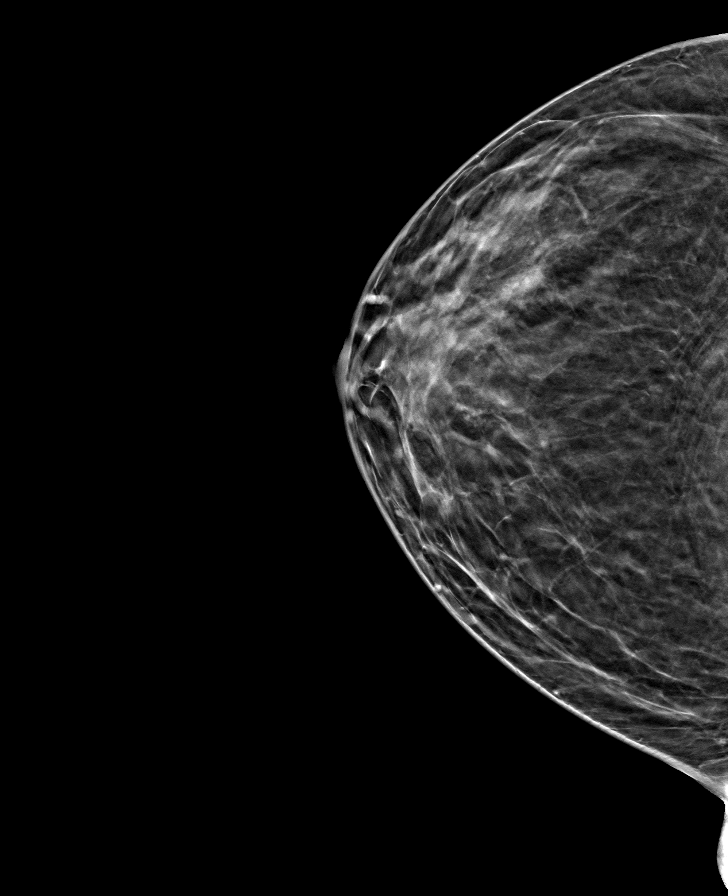

[L MLO tomo · tomo slice 30/59.0]
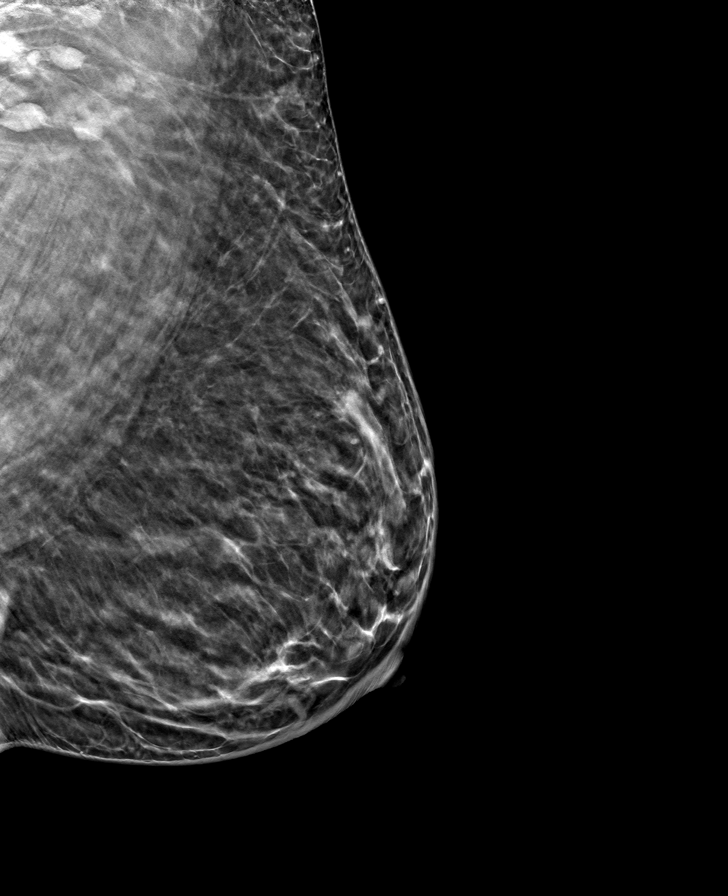

[8 of 24 positions shown; findings below may reference images not displayed]

ACR Breast Density Category b: There are scattered areas of
fibroglandular density.
FINDINGS: There are no findings suspicious for malignancy. The images were
evaluated with computer-aided detection.
IMPRESSION: No mammographic evidence of malignancy. A result letter of this
screening mammogram will be mailed directly to the patient.

RECOMMENDATION:
Screening mammogram in one year. (Code:WJ-I-BG6)

BI-RADS CATEGORY  1: Negative.

## 2021-08-15 ENCOUNTER — Telehealth: Payer: Self-pay | Admitting: Oncology

## 2021-08-15 NOTE — Telephone Encounter (Signed)
Called patient regarding upcoming appointments, patient is notified and is My chart active.

## 2021-08-29 DIAGNOSIS — Z903 Acquired absence of stomach [part of]: Secondary | ICD-10-CM | POA: Diagnosis not present

## 2021-08-29 DIAGNOSIS — R635 Abnormal weight gain: Secondary | ICD-10-CM | POA: Diagnosis not present

## 2021-09-12 DIAGNOSIS — M9903 Segmental and somatic dysfunction of lumbar region: Secondary | ICD-10-CM | POA: Diagnosis not present

## 2021-09-12 DIAGNOSIS — M47812 Spondylosis without myelopathy or radiculopathy, cervical region: Secondary | ICD-10-CM | POA: Diagnosis not present

## 2021-09-12 DIAGNOSIS — M5032 Other cervical disc degeneration, mid-cervical region, unspecified level: Secondary | ICD-10-CM | POA: Diagnosis not present

## 2021-09-12 DIAGNOSIS — M47816 Spondylosis without myelopathy or radiculopathy, lumbar region: Secondary | ICD-10-CM | POA: Diagnosis not present

## 2021-09-19 DIAGNOSIS — M47812 Spondylosis without myelopathy or radiculopathy, cervical region: Secondary | ICD-10-CM | POA: Diagnosis not present

## 2021-09-19 DIAGNOSIS — M9903 Segmental and somatic dysfunction of lumbar region: Secondary | ICD-10-CM | POA: Diagnosis not present

## 2021-09-19 DIAGNOSIS — M5032 Other cervical disc degeneration, mid-cervical region, unspecified level: Secondary | ICD-10-CM | POA: Diagnosis not present

## 2021-09-19 DIAGNOSIS — M47816 Spondylosis without myelopathy or radiculopathy, lumbar region: Secondary | ICD-10-CM | POA: Diagnosis not present

## 2021-10-10 ENCOUNTER — Encounter: Payer: Self-pay | Admitting: Internal Medicine

## 2021-10-10 ENCOUNTER — Other Ambulatory Visit: Payer: Self-pay

## 2021-10-10 ENCOUNTER — Ambulatory Visit: Payer: BC Managed Care – PPO | Admitting: Internal Medicine

## 2021-10-10 VITALS — BP 118/70 | HR 61 | Temp 98.2°F | Ht 66.8 in | Wt 186.0 lb

## 2021-10-10 DIAGNOSIS — D472 Monoclonal gammopathy: Secondary | ICD-10-CM

## 2021-10-10 DIAGNOSIS — W010XXA Fall on same level from slipping, tripping and stumbling without subsequent striking against object, initial encounter: Secondary | ICD-10-CM

## 2021-10-10 DIAGNOSIS — I10 Essential (primary) hypertension: Secondary | ICD-10-CM | POA: Diagnosis not present

## 2021-10-10 DIAGNOSIS — E663 Overweight: Secondary | ICD-10-CM

## 2021-10-10 DIAGNOSIS — Z6829 Body mass index (BMI) 29.0-29.9, adult: Secondary | ICD-10-CM

## 2021-10-10 DIAGNOSIS — W1840XA Slipping, tripping and stumbling without falling, unspecified, initial encounter: Secondary | ICD-10-CM

## 2021-10-10 DIAGNOSIS — M79674 Pain in right toe(s): Secondary | ICD-10-CM | POA: Diagnosis not present

## 2021-10-10 DIAGNOSIS — R202 Paresthesia of skin: Secondary | ICD-10-CM

## 2021-10-10 NOTE — Patient Instructions (Signed)

## 2021-10-10 NOTE — Progress Notes (Signed)
Rich Brave [place],acting as a Education administrator for Maximino Greenland, MD.,have documented all relevant documentation on the behalf of Maximino Greenland, MD,as directed by  Maximino Greenland, MD while in the presence of Maximino Greenland, MD.  This visit occurred during the SARS-CoV-2 public health emergency.  Safety protocols were in place, including screening questions prior to the visit, additional usage of staff PPE, and extensive cleaning of exam room while observing appropriate contact time as indicated for disinfecting solutions.  Subjective:     Patient ID: [name] [name] , female    DOB: [birth year] , 61 y.o.   MRN: [id number]   Chief Complaint  Patient presents with   Hypertension    HPI  She is here today for a follow-up on her blood pressure.  She reports compliance with meds. She denies headaches, chest pain and shortness of breath.   Hypertension This is a chronic problem. The current episode started more than 1 year ago. The problem has been gradually improving since onset. The problem is controlled. Pertinent negatives include no blurred vision, chest pain, palpitations or shortness of breath. Risk factors for coronary artery disease include obesity, post-menopausal state and sedentary lifestyle. The current treatment provides moderate improvement. Compliance problems include exercise.     Past Medical History:  Diagnosis Date   Abnormal glucose    Candidiasis    Cervicalgia    Essential hypertension 12/07/2016   Flatulence    Hypertension    Localized swelling, mass and lump, head    Lumbago    Morbid obesity ([place]) 12/07/2016   Obesity 12/07/2016   Onychomycosis      Family History  Problem Relation Age of Onset   Hypertension Mother    Heart failure Father    Diabetes Father    Heart attack Father    Breast cancer Neg Hx      Current Outpatient Medications:    amLODipine (NORVASC) 5 MG tablet, Take 1 tablet (5 mg total) by mouth daily., Disp: 90 tablet, Rfl: 1   Calcium  Citrate-Vitamin D (CELEBRATE CALCIUM PLUS 500 PO), Take by mouth. 3 chewables daily, Disp: , Rfl:    carvedilol (COREG) 12.5 MG tablet, TAKE 1 TABLET BY MOUTH TWICE DAILY(APPT REQUIRED FOR FUTURE REFILLS), Disp: 180 tablet, Rfl: 1   diclofenac Sodium (VOLTAREN) 1 % GEL, Apply 2 g topically 4 (four) times daily. prn, Disp: 100 g, Rfl: 1   Multiple Vitamins-Minerals (CELEBRATE MULTI-COMPLETE 18) CHEW, Chew by mouth. 1 per day, Disp: , Rfl:    TURMERIC CURCUMIN PO, Take 1 capsule by mouth daily., Disp: , Rfl:    No Known Allergies   Review of Systems  Constitutional: Negative.   Eyes:  Negative for blurred vision.  Respiratory: Negative.  Negative for shortness of breath.   Cardiovascular: Negative.  Negative for chest pain and palpitations.  Musculoskeletal:  Positive for arthralgias.       She c/o r toe pain. States she slipped down the steps on 2/16, she did not fall to the ground.  States her toes were caught beneath her. She does admit that she hit her right elbow on the wall. She had on bedroom slippers at the time of the fall. She did not seek medical attention.   Neurological:  Positive for numbness.       She c/o tingling in her LUE. Denies LUE weakness. She is not sure what is contributing/trigger her sx  Psychiatric/Behavioral: Negative.      Today's Vitals   10/10/21  1510  BP: 118/70  Pulse: 61  Temp: 98.2 F (36.8 C)  Weight: 186 lb (84.4 kg)  Height: 5' 6.8" (1.697 m)  PainSc: 0-No pain   Body mass index is 29.31 kg/m.  Wt Readings from Last 3 Encounters:  10/10/21 186 lb (84.4 kg)  07/05/21 185 lb 12.8 oz (84.3 kg)  07/04/21 189 lb 6.4 oz (85.9 kg)     Objective:  Physical Exam Vitals and nursing note reviewed.  Constitutional:      Appearance: Normal appearance.  HENT:     Head: Normocephalic and atraumatic.     Nose:     Comments: Masked     Mouth/Throat:     Comments: Masked  Eyes:     Extraocular Movements: Extraocular movements intact.   Cardiovascular:     Rate and Rhythm: Normal rate and regular rhythm.     Heart sounds: Normal heart sounds.  Pulmonary:     Effort: Pulmonary effort is normal.     Breath sounds: Normal breath sounds.  Musculoskeletal:        General: Swelling present.     Cervical back: Normal range of motion.     Comments: She has walking boot on RLE  Skin:    General: Skin is warm.  Neurological:     General: No focal deficit present.     Mental Status: She is alert.  Psychiatric:        Mood and Affect: Mood normal.        Behavior: Behavior normal.     Assessment And Plan:     1. Essential hypertension Comments: Chronic, well controlled. No med changes. Encouraged to follow low sodium diet.  I will check renal function today. She will f/u in 6 months.  - BMP8+EGFR  2. MGUS (monoclonal gammopathy of unknown significance) Comments: Chronic, I appreciate Hem/Onc input. She is currently under active surveillance.   3. Slipping, tripping and stumbling without falling, unspecified, initial encounter Comments: Occurred on 10/04/21. She is encouraged to hold railing when walking down steps and to avoid wearing bedroom slippers on the steps.   4. Great toe pain, right Comments: Due to above. She is advised to apply Voltaren gel to affected area BID-TID prn.   5. Paresthesias in left hand Comments: I will check vitamin B12 level today. We also discussed possibility of nerve entrapment. May need EMG/NCS in the future.  - Vitamin B12  6. Overweight (BMI 25.0-29.9) Comments: BMI 29. She is s/p bariatric surgery. She is encouraged to strive for at least 150 minutes of exercise per week.    Patient was given opportunity to ask questions. Patient verbalized understanding of the plan and was able to repeat key elements of the plan. All questions were answered to their satisfaction.   I, Maximino Greenland, MD, have reviewed all documentation for this visit. The documentation on 10/10/21 for the exam,  diagnosis, procedures, and orders are all accurate and complete.   IF YOU HAVE BEEN REFERRED TO A SPECIALIST, IT MAY TAKE 1-2 WEEKS TO SCHEDULE/PROCESS THE REFERRAL. IF YOU HAVE NOT HEARD FROM US/SPECIALIST IN TWO WEEKS, PLEASE GIVE [place] A CALL AT [phone].   THE PATIENT IS ENCOURAGED TO PRACTICE SOCIAL DISTANCING DUE TO THE COVID-19 PANDEMIC.

## 2021-10-11 ENCOUNTER — Encounter: Payer: BC Managed Care – PPO | Admitting: Internal Medicine

## 2021-10-11 ENCOUNTER — Other Ambulatory Visit: Payer: Self-pay

## 2021-10-11 LAB — BMP8+EGFR
BUN/Creatinine Ratio: 24 — ABNORMAL HIGH (ref 9–23)
BUN: 20 mg/dL (ref 6–24)
CO2: 26 mmol/L (ref 20–29)
Calcium: 9.4 mg/dL (ref 8.7–10.2)
Chloride: 99 mmol/L (ref 96–106)
Creatinine, Ser: 0.82 mg/dL (ref 0.57–1.00)
Glucose: 68 mg/dL — ABNORMAL LOW (ref 70–99)
Potassium: 4.3 mmol/L (ref 3.5–5.2)
Sodium: 141 mmol/L (ref 134–144)
eGFR: 83 mL/min/{1.73_m2} (ref >59)

## 2021-10-11 LAB — VITAMIN B12: Vitamin B-12: 640 pg/mL (ref 232–1245)

## 2021-10-11 MED ORDER — DICLOFENAC SODIUM 1 % EX GEL: 2.0000 g | Freq: Four times a day (QID) | CUTANEOUS | 1 refills | Status: DC

## 2021-10-17 DIAGNOSIS — M9903 Segmental and somatic dysfunction of lumbar region: Secondary | ICD-10-CM | POA: Diagnosis not present

## 2021-10-17 DIAGNOSIS — M47812 Spondylosis without myelopathy or radiculopathy, cervical region: Secondary | ICD-10-CM | POA: Diagnosis not present

## 2021-10-17 DIAGNOSIS — M5032 Other cervical disc degeneration, mid-cervical region, unspecified level: Secondary | ICD-10-CM | POA: Diagnosis not present

## 2021-10-17 DIAGNOSIS — M47816 Spondylosis without myelopathy or radiculopathy, lumbar region: Secondary | ICD-10-CM | POA: Diagnosis not present

## 2021-10-31 DIAGNOSIS — M47816 Spondylosis without myelopathy or radiculopathy, lumbar region: Secondary | ICD-10-CM | POA: Diagnosis not present

## 2021-10-31 DIAGNOSIS — M47812 Spondylosis without myelopathy or radiculopathy, cervical region: Secondary | ICD-10-CM | POA: Diagnosis not present

## 2021-10-31 DIAGNOSIS — M9903 Segmental and somatic dysfunction of lumbar region: Secondary | ICD-10-CM | POA: Diagnosis not present

## 2021-10-31 DIAGNOSIS — M5032 Other cervical disc degeneration, mid-cervical region, unspecified level: Secondary | ICD-10-CM | POA: Diagnosis not present

## 2021-11-14 DIAGNOSIS — M9903 Segmental and somatic dysfunction of lumbar region: Secondary | ICD-10-CM | POA: Diagnosis not present

## 2021-11-14 DIAGNOSIS — M47812 Spondylosis without myelopathy or radiculopathy, cervical region: Secondary | ICD-10-CM | POA: Diagnosis not present

## 2021-11-14 DIAGNOSIS — M47816 Spondylosis without myelopathy or radiculopathy, lumbar region: Secondary | ICD-10-CM | POA: Diagnosis not present

## 2021-11-14 DIAGNOSIS — M5032 Other cervical disc degeneration, mid-cervical region, unspecified level: Secondary | ICD-10-CM | POA: Diagnosis not present

## 2021-12-12 DIAGNOSIS — M47816 Spondylosis without myelopathy or radiculopathy, lumbar region: Secondary | ICD-10-CM | POA: Diagnosis not present

## 2021-12-12 DIAGNOSIS — M9903 Segmental and somatic dysfunction of lumbar region: Secondary | ICD-10-CM | POA: Diagnosis not present

## 2021-12-12 DIAGNOSIS — M47812 Spondylosis without myelopathy or radiculopathy, cervical region: Secondary | ICD-10-CM | POA: Diagnosis not present

## 2021-12-12 DIAGNOSIS — M5032 Other cervical disc degeneration, mid-cervical region, unspecified level: Secondary | ICD-10-CM | POA: Diagnosis not present

## 2021-12-17 ENCOUNTER — Other Ambulatory Visit: Payer: Self-pay | Admitting: Internal Medicine

## 2022-01-18 ENCOUNTER — Inpatient Hospital Stay: Payer: BC Managed Care – PPO

## 2022-01-18 ENCOUNTER — Inpatient Hospital Stay: Payer: BC Managed Care – PPO | Admitting: Oncology

## 2022-03-06 ENCOUNTER — Encounter: Payer: Self-pay | Admitting: Internal Medicine

## 2022-03-06 ENCOUNTER — Other Ambulatory Visit: Payer: Self-pay

## 2022-03-06 ENCOUNTER — Inpatient Hospital Stay: Payer: BC Managed Care – PPO | Admitting: Oncology

## 2022-03-06 ENCOUNTER — Ambulatory Visit (INDEPENDENT_AMBULATORY_CARE_PROVIDER_SITE_OTHER): Payer: BC Managed Care – PPO | Admitting: Internal Medicine

## 2022-03-06 ENCOUNTER — Inpatient Hospital Stay: Payer: BC Managed Care – PPO | Attending: Oncology

## 2022-03-06 VITALS — BP 139/88 | HR 60 | Temp 97.8°F | Resp 17 | Ht 66.8 in | Wt 192.8 lb

## 2022-03-06 VITALS — BP 122/60 | HR 58 | Temp 98.0°F | Ht 66.6 in | Wt 190.8 lb

## 2022-03-06 DIAGNOSIS — D472 Monoclonal gammopathy: Secondary | ICD-10-CM | POA: Insufficient documentation

## 2022-03-06 DIAGNOSIS — Z0000 Encounter for general adult medical examination without abnormal findings: Secondary | ICD-10-CM

## 2022-03-06 DIAGNOSIS — R3915 Urgency of urination: Secondary | ICD-10-CM

## 2022-03-06 DIAGNOSIS — Z6830 Body mass index (BMI) 30.0-30.9, adult: Secondary | ICD-10-CM

## 2022-03-06 DIAGNOSIS — E6609 Other obesity due to excess calories: Secondary | ICD-10-CM | POA: Diagnosis not present

## 2022-03-06 DIAGNOSIS — Z862 Personal history of diseases of the blood and blood-forming organs and certain disorders involving the immune mechanism: Secondary | ICD-10-CM | POA: Diagnosis not present

## 2022-03-06 DIAGNOSIS — I10 Essential (primary) hypertension: Secondary | ICD-10-CM

## 2022-03-06 LAB — CMP (CANCER CENTER ONLY)
ALT: 5 U/L (ref 0–44)
AST: 16 U/L (ref 15–41)
Albumin: 4.4 g/dL (ref 3.5–5.0)
Alkaline Phosphatase: 59 U/L (ref 38–126)
Anion gap: 4 — ABNORMAL LOW (ref 5–15)
BUN: 18 mg/dL (ref 6–20)
CO2: 32 mmol/L (ref 22–32)
Calcium: 9.5 mg/dL (ref 8.9–10.3)
Chloride: 103 mmol/L (ref 98–111)
Creatinine: 0.9 mg/dL (ref 0.44–1.00)
GFR, Estimated: >60 mL/min (ref >60)
Glucose, Bld: 90 mg/dL (ref 70–99)
Potassium: 3.5 mmol/L (ref 3.5–5.1)
Sodium: 139 mmol/L (ref 135–145)
Total Bilirubin: 0.5 mg/dL (ref 0.3–1.2)
Total Protein: 7.9 g/dL (ref 6.5–8.1)

## 2022-03-06 LAB — CBC WITH DIFFERENTIAL (CANCER CENTER ONLY)
Abs Immature Granulocytes: 0 10*3/uL (ref 0.00–0.07)
Basophils Absolute: 0 10*3/uL (ref 0.0–0.1)
Basophils Relative: 1 %
Eosinophils Absolute: 0.1 10*3/uL (ref 0.0–0.5)
Eosinophils Relative: 2 %
HCT: 37.4 % (ref 36.0–46.0)
Hemoglobin: 12.4 g/dL (ref 12.0–15.0)
Immature Granulocytes: 0 %
Lymphocytes Relative: 41 %
Lymphs Abs: 1.6 10*3/uL (ref 0.7–4.0)
MCH: 28.6 pg (ref 26.0–34.0)
MCHC: 33.2 g/dL (ref 30.0–36.0)
MCV: 86.4 fL (ref 80.0–100.0)
Monocytes Absolute: 0.3 10*3/uL (ref 0.1–1.0)
Monocytes Relative: 7 %
Neutro Abs: 2 10*3/uL (ref 1.7–7.7)
Neutrophils Relative %: 49 %
Platelet Count: 167 10*3/uL (ref 150–400)
RBC: 4.33 MIL/uL (ref 3.87–5.11)
RDW: 15.5 % (ref 11.5–15.5)
WBC Count: 4 10*3/uL (ref 4.0–10.5)
nRBC: 0 % (ref 0.0–0.2)

## 2022-03-06 LAB — POCT URINALYSIS DIPSTICK
Bilirubin, UA: NEGATIVE
Blood, UA: NEGATIVE
Glucose, UA: NEGATIVE
Ketones, UA: NEGATIVE
Leukocytes, UA: NEGATIVE
Nitrite, UA: NEGATIVE
Protein, UA: NEGATIVE
Spec Grav, UA: 1.01 (ref 1.010–1.025)
Urobilinogen, UA: 0.2 E.U./dL
pH, UA: 6 (ref 5.0–8.0)

## 2022-03-06 NOTE — Progress Notes (Signed)
Hematology and Oncology Follow Up Visit  [name] [name] [id number] 1961-05-27 61 y.o. 03/06/2022 9:04 AM [name] [name], [name] [name], [name] Mech, MD   Principle Diagnosis: 61 year old woman with MGUS diagnosed in 2018.  She was found to have IgG subtype with  M spike of 1 g/dL and no endorgan damage.   Current therapy: Active surveillance.  Interim History: [name] [name] returns for a follow-up visit.  Since her last visit, she reports feeling well without any complaints.  She denies any nausea, vomiting or abdominal pain.  She denies any hospitalizations or illnesses.  She denies any bone pain or pathological fractures.  Medications: Reviewed without changes. Current Outpatient Medications  Medication Sig Dispense Refill   amLODipine (NORVASC) 5 MG tablet Take 1 tablet by mouth once daily 90 tablet 0   Calcium Citrate-Vitamin D (CELEBRATE CALCIUM PLUS 500 PO) Take by mouth. 3 chewables daily     carvedilol (COREG) 12.5 MG tablet TAKE 1 TABLET BY MOUTH TWICE DAILY(APPT REQUIRED FOR FUTURE REFILLS) 180 tablet 1   diclofenac Sodium (VOLTAREN) 1 % GEL Apply 2 g topically 4 (four) times daily. prn 100 g 1   Multiple Vitamins-Minerals (CELEBRATE MULTI-COMPLETE 18) CHEW Chew by mouth. 1 per day     TURMERIC CURCUMIN PO Take 1 capsule by mouth daily.     No current facility-administered medications for this visit.     Allergies: No Known Allergies    . Physical Exam:  Blood pressure 139/88, pulse 60, temperature 97.8 F (36.6 C), temperature source Temporal, resp. rate 17, height 5' 6.8" (1.697 m), weight 192 lb 12.8 oz (87.5 kg), SpO2 100 %.   ECOG: 0    General appearance: Alert, awake without any distress. Head: Atraumatic without abnormalities Oropharynx: Without any thrush or ulcers. Eyes: No scleral icterus. Lymph nodes: No lymphadenopathy noted in the cervical, supraclavicular, or axillary nodes Heart:regular rate and rhythm, without any murmurs or gallops.   Lung: Clear to  auscultation without any rhonchi, wheezes or dullness to percussion. Abdomin: Soft, nontender without any shifting dullness or ascites. Musculoskeletal: No clubbing or cyanosis. Neurological: No motor or sensory deficits. Skin: No rashes or lesions.      Lab Results: Lab Results  Component Value Date   WBC 4.9 01/18/2021   HGB 12.3 01/18/2021   HCT 37.9 01/18/2021   MCV 87.1 01/18/2021   PLT 175 01/18/2021     Chemistry      Component Value Date/Time   NA 141 10/10/2021 1554   NA 139 08/22/2017 1254   K 4.3 10/10/2021 1554   K 3.9 08/22/2017 1254   CL 99 10/10/2021 1554   CO2 26 10/10/2021 1554   CO2 31 (H) 08/22/2017 1254   BUN 20 10/10/2021 1554   BUN 15.3 08/22/2017 1254   CREATININE 0.82 10/10/2021 1554   CREATININE 0.86 01/18/2021 1407   CREATININE 0.9 08/22/2017 1254      Component Value Date/Time   CALCIUM 9.4 10/10/2021 1554   CALCIUM 9.3 08/22/2017 1254   ALKPHOS 65 01/18/2021 1407   ALKPHOS 52 08/22/2017 1254   AST 14 (L) 01/18/2021 1407   AST 15 08/22/2017 1254   ALT <6 01/18/2021 1407   ALT 7 08/22/2017 1254   BILITOT 0.7 01/18/2021 1407   BILITOT 0.51 08/22/2017 1254       Latest Reference Range & Units 08/22/17 12:54 09/04/18 12:22 01/18/21 14:07  IgG (Immunoglobin G), Serum 586 - 1,602 mg/dL 1,931 (H) 1,812 (H) 1,723 (H)  IgM (Immunoglobulin M), Srm 26 - 217 mg/dL  80 98  IgA 87 - 352 mg/dL  98 132  IgA/Immunoglobulin A, Serum 87 - 352 mg/dL 92    (H): Data is abnormally high     Impression and Plan:  61 year old woman with:    1.  MGUS diagnosed in 2018.  She presented with IgG kappa subtype without any evidence of endorgan damage.  Her disease status was updated at this time and treatment options were reviewed.  Protein studies from a June 2022 were discussed and showed a IgG level is declining an M spike that has not changed.  Her CBC from today showed normal hematological parameters with normal kidney function on October 10, 2021.  The risk of progression into multiple myeloma was discussed today in detail and remains reasonably low at this time.  I recommended continued monitoring and we will increase the interval between visits to 18 months.  2.  Follow-up: In 18 months for follow-up.  30  minutes were spent on this encounter.  The time was dedicated to reviewing laboratory data, disease status update and the complications associated with her condition.    [name] Button, MD 7/19/20239:04 AM

## 2022-03-06 NOTE — Patient Instructions (Signed)

## 2022-03-06 NOTE — Progress Notes (Signed)
I,Tianna Badgett,acting as a Education administrator for Maximino Greenland, MD.,have documented all relevant documentation on the behalf of Maximino Greenland, MD,as directed by  Maximino Greenland, MD while in the presence of Maximino Greenland, MD.  Subjective:     Patient ID: [name] [name] , female    DOB: 1960-12-05 , 61 y.o.   MRN: [id number]   Chief Complaint  Patient presents with   Annual Exam   Hypertension    HPI  She is here today for a full physical examination.  She is not followed by GYN. She is s/p hysterectomy. Additionally, she had gastric sleeve surgery performed Dec 2021.  She reports compliance with meds. She denies headaches, chest pain and shortness of breath.   Hypertension This is a chronic problem. The current episode started more than 1 year ago. The problem has been gradually improving since onset. The problem is controlled. Pertinent negatives include no blurred vision, chest pain, palpitations or shortness of breath. Risk factors for coronary artery disease include obesity, post-menopausal state and sedentary lifestyle. The current treatment provides moderate improvement. Compliance problems include exercise.      Past Medical History:  Diagnosis Date   Abnormal glucose    Candidiasis    Cervicalgia    Essential hypertension 12/07/2016   Flatulence    Hypertension    Localized swelling, mass and lump, head    Lumbago    Morbid obesity ([place]) 12/07/2016   Obesity 12/07/2016   Onychomycosis      Family History  Problem Relation Age of Onset   Hypertension Mother    Heart failure Father    Diabetes Father    Heart attack Father    Breast cancer Neg Hx      Current Outpatient Medications:    Biotin 10000 MCG TABS, Take by mouth., Disp: , Rfl:    psyllium (METAMUCIL) 58.6 % packet, Take 1 packet by mouth daily., Disp: , Rfl:    amLODipine (NORVASC) 5 MG tablet, Take 1 tablet by mouth once daily, Disp: 90 tablet, Rfl: 0   Calcium Citrate-Vitamin D (CELEBRATE CALCIUM PLUS 500  PO), Take by mouth. 3 chewables daily, Disp: , Rfl:    carvedilol (COREG) 12.5 MG tablet, TAKE 1 TABLET BY MOUTH TWICE DAILY(APPT REQUIRED FOR FUTURE REFILLS), Disp: 180 tablet, Rfl: 1   Multiple Vitamins-Minerals (CELEBRATE MULTI-COMPLETE 18) CHEW, Chew by mouth. 1 per day, Disp: , Rfl:    TURMERIC CURCUMIN PO, Take 1 capsule by mouth daily., Disp: , Rfl:    No Known Allergies    The patient states she uses post menopausal status for birth control. Last LMP was No LMP recorded (lmp unknown). Patient has had a hysterectomy.. Negative for Dysmenorrhea. Negative for: breast discharge, breast lump(s), breast pain and breast self exam. Associated symptoms include abnormal vaginal bleeding. Pertinent negatives include abnormal bleeding (hematology), anxiety, decreased libido, depression, difficulty falling sleep, dyspareunia, history of infertility, nocturia, sexual dysfunction, sleep disturbances, urinary incontinence, urinary urgency, vaginal discharge and vaginal itching. Diet regular.The patient states her exercise level is  intermittent.  . The patient's tobacco use is:  Social History   Tobacco Use  Smoking Status Never  Smokeless Tobacco Never  . She has been exposed to passive smoke. The patient's alcohol use is:  Social History   Substance and Sexual Activity  Alcohol Use None    Review of Systems  Constitutional: Negative.   HENT: Negative.    Eyes: Negative.  Negative for blurred vision.  Respiratory: Negative.  Negative for shortness of breath.   Cardiovascular: Negative.  Negative for chest pain and palpitations.  Gastrointestinal: Negative.   Endocrine: Negative.   Genitourinary:  Positive for urgency.  Musculoskeletal: Negative.   Skin: Negative.   Allergic/Immunologic: Negative.   Neurological:  Positive for numbness.       Tingling in left leg, c/o sharp pains in the front of her left thigh  Hematological: Negative.   Psychiatric/Behavioral: Negative.       Today's  Vitals   03/06/22 1041  BP: 122/60  Pulse: (!) 58  Temp: 98 F (36.7 C)  TempSrc: Oral  Weight: 190 lb 12.8 oz (86.5 kg)  Height: 5' 6.6" (1.692 m)   Body mass index is 30.24 kg/m.  Wt Readings from Last 3 Encounters:  03/06/22 190 lb 12.8 oz (86.5 kg)  03/06/22 192 lb 12.8 oz (87.5 kg)  10/10/21 186 lb (84.4 kg)    Objective:  Physical Exam Vitals and nursing note reviewed.  Constitutional:      Appearance: Normal appearance.  HENT:     Head: Normocephalic and atraumatic.     Right Ear: Tympanic membrane, ear canal and external ear normal.     Left Ear: Tympanic membrane, ear canal and external ear normal.     Nose: Nose normal.     Mouth/Throat:     Mouth: Mucous membranes are moist.     Pharynx: Oropharynx is clear.  Eyes:     Extraocular Movements: Extraocular movements intact.     Conjunctiva/sclera: Conjunctivae normal.     Pupils: Pupils are equal, round, and reactive to light.  Cardiovascular:     Rate and Rhythm: Normal rate and regular rhythm.     Pulses: Normal pulses.     Heart sounds: Normal heart sounds.  Pulmonary:     Effort: Pulmonary effort is normal.     Breath sounds: Normal breath sounds.  Chest:  Breasts:    Tanner Score is 5.     Right: Normal.     Left: Normal.  Abdominal:     General: Abdomen is flat. Bowel sounds are normal.     Palpations: Abdomen is soft.  Genitourinary:    Comments: deferred Musculoskeletal:        General: Normal range of motion.     Cervical back: Normal range of motion and neck supple.  Skin:    General: Skin is warm and dry.  Neurological:     General: No focal deficit present.     Mental Status: She is alert and oriented to person, place, and time.  Psychiatric:        Mood and Affect: Mood normal.        Behavior: Behavior normal.         Assessment And Plan:     1. Routine general medical examination at health care facility Comments: A full exam was performed. Importance of monthly self breast  exams was discussed with the patient. PATIENT IS ADVISED TO GET 30-45 MINUTES REGULAR EXERCISE NO LESS THAN FOUR TO FIVE DAYS PER WEEK - BOTH WEIGHTBEARING EXERCISES AND AEROBIC ARE RECOMMENDED.  PATIENT IS ADVISED TO FOLLOW A HEALTHY DIET WITH AT LEAST SIX FRUITS/VEGGIES PER DAY, DECREASE INTAKE OF RED MEAT, AND TO INCREASE FISH INTAKE TO TWO DAYS PER WEEK.  MEATS/FISH SHOULD NOT BE FRIED, BAKED OR BROILED IS PREFERABLE.  IT IS ALSO IMPORTANT TO CUT BACK ON YOUR SUGAR INTAKE. PLEASE AVOID ANYTHING WITH ADDED SUGAR, CORN SYRUP OR OTHER SWEETENERS. IF YOU  MUST USE A SWEETENER, YOU CAN TRY STEVIA. IT IS ALSO IMPORTANT TO AVOID ARTIFICIALLY SWEETENERS AND DIET BEVERAGES. LASTLY, I SUGGEST WEARING SPF 50 SUNSCREEN ON EXPOSED PARTS AND ESPECIALLY WHEN IN THE DIRECT SUNLIGHT FOR AN EXTENDED PERIOD OF TIME.  PLEASE AVOID FAST FOOD RESTAURANTS AND INCREASE YOUR WATER INTAKE. - Lipid panel - Vitamin D (25 hydroxy)  2. Essential hypertension Comments: Chronic, well controlled. EKG performed, SB w/o acute changes. Advised to follow low sodium diet.  She will c/w amlodipine '5mg'$  daily and carvedilol 12.'5mg'$  po bid. She will f/u in 6 months.  - EKG 12-Lead - Microalbumin / Creatinine Urine Ratio - POCT Urinalysis Dipstick (81002)  3. Urinary urgency Comments: We discussed use of pelvic floor exercises. I will check U/A to r/o UTI. She agrees to PT referral for pelvic floor strengthening.  - Ambulatory referral to Physical Therapy  4. Class 1 obesity due to excess calories with serious comorbidity and body mass index (BMI) of 30.0 to 30.9 in adult Comments: She has gained 4 lbs since Feb 2023. She is s/p gastric sleeve resection.  She's encouraged to resume exercise aiming for at least 150 min of exercise/week.  5. History of anemia due to vitamin B12 deficiency Comments: I will check vitamin B12 level today and supplement as needed.  - Vitamin B12  Patient was given opportunity to ask questions. Patient  verbalized understanding of the plan and was able to repeat key elements of the plan. All questions were answered to their satisfaction.   I, Maximino Greenland, MD, have reviewed all documentation for this visit. The documentation on 03/06/22 for the exam, diagnosis, procedures, and orders are all accurate and complete.  THE PATIENT IS ENCOURAGED TO PRACTICE SOCIAL DISTANCING DUE TO THE COVID-19 PANDEMIC.

## 2022-03-07 LAB — MICROALBUMIN / CREATININE URINE RATIO
Creatinine, Urine: 56.9 mg/dL
Microalb/Creat Ratio: <5 mg/g creat (ref 0–29)
Microalbumin, Urine: <3 ug/mL

## 2022-03-07 LAB — LIPID PANEL
Chol/HDL Ratio: 2.8 ratio (ref 0.0–4.4)
Cholesterol, Total: 170 mg/dL (ref 100–199)
HDL: 60 mg/dL (ref >39)
LDL Chol Calc (NIH): 100 mg/dL — ABNORMAL HIGH (ref 0–99)
Triglycerides: 47 mg/dL (ref 0–149)
VLDL Cholesterol Cal: 10 mg/dL (ref 5–40)

## 2022-03-07 LAB — KAPPA/LAMBDA LIGHT CHAINS
Kappa free light chain: 19.5 mg/L — ABNORMAL HIGH (ref 3.3–19.4)
Kappa, lambda light chain ratio: 1.71 — ABNORMAL HIGH (ref 0.26–1.65)
Lambda free light chains: 11.4 mg/L (ref 5.7–26.3)

## 2022-03-07 LAB — VITAMIN B12: Vitamin B-12: 762 pg/mL (ref 232–1245)

## 2022-03-07 LAB — VITAMIN D 25 HYDROXY (VIT D DEFICIENCY, FRACTURES): Vit D, 25-Hydroxy: 79.7 ng/mL (ref 30.0–100.0)

## 2022-03-10 ENCOUNTER — Other Ambulatory Visit: Payer: Self-pay | Admitting: Internal Medicine

## 2022-03-11 LAB — MULTIPLE MYELOMA PANEL, SERUM
Albumin SerPl Elph-Mcnc: 3.8 g/dL (ref 2.9–4.4)
Albumin/Glob SerPl: 1.1 (ref 0.7–1.7)
Alpha 1: 0.2 g/dL (ref 0.0–0.4)
Alpha2 Glob SerPl Elph-Mcnc: 0.7 g/dL (ref 0.4–1.0)
B-Globulin SerPl Elph-Mcnc: 0.9 g/dL (ref 0.7–1.3)
Gamma Glob SerPl Elph-Mcnc: 1.9 g/dL — ABNORMAL HIGH (ref 0.4–1.8)
Globulin, Total: 3.7 g/dL (ref 2.2–3.9)
IgA: 112 mg/dL (ref 87–352)
IgG (Immunoglobin G), Serum: 1777 mg/dL — ABNORMAL HIGH (ref 586–1602)
IgM (Immunoglobulin M), Srm: 97 mg/dL (ref 26–217)
M Protein SerPl Elph-Mcnc: 0.7 g/dL — ABNORMAL HIGH
Total Protein ELP: 7.5 g/dL (ref 6.0–8.5)

## 2022-04-03 DIAGNOSIS — N3941 Urge incontinence: Secondary | ICD-10-CM | POA: Diagnosis not present

## 2022-04-03 DIAGNOSIS — R278 Other lack of coordination: Secondary | ICD-10-CM | POA: Diagnosis not present

## 2022-04-03 DIAGNOSIS — M6281 Muscle weakness (generalized): Secondary | ICD-10-CM | POA: Diagnosis not present

## 2022-04-13 ENCOUNTER — Other Ambulatory Visit: Payer: Self-pay | Admitting: Internal Medicine

## 2022-06-26 ENCOUNTER — Other Ambulatory Visit: Payer: Self-pay | Admitting: Internal Medicine

## 2022-06-26 DIAGNOSIS — Z1231 Encounter for screening mammogram for malignant neoplasm of breast: Secondary | ICD-10-CM

## 2022-06-27 ENCOUNTER — Ambulatory Visit: Payer: Self-pay

## 2022-06-27 NOTE — Patient Outreach (Signed)
  Care Coordination   06/27/2022 Name: [name] [name] MRN: [id number] DOB: [birth year]   Care Coordination Outreach Attempts:  An unsuccessful telephone outreach was attempted today to offer the patient information about available care coordination services as a benefit of their health plan.   Follow Up Plan:  Additional outreach attempts will be made to offer the patient care coordination information and services.   Encounter Outcome:  No Answer  Care Coordination Interventions Activated:  No   Care Coordination Interventions:  No, not indicated    Daneen Schick, BSW, CDP Social Worker, Certified Dementia Practitioner [facility]  Care Coordination [phone]

## 2022-07-02 ENCOUNTER — Ambulatory Visit
Admission: RE | Admit: 2022-07-02 | Discharge: 2022-07-02 | Disposition: A | Discharge status: Home / Self Care | Payer: BC Managed Care – PPO | Source: Ambulatory Visit | Attending: Internal Medicine | Admitting: Internal Medicine

## 2022-07-02 DIAGNOSIS — Z1231 Encounter for screening mammogram for malignant neoplasm of breast: Secondary | ICD-10-CM

## 2022-07-17 ENCOUNTER — Other Ambulatory Visit: Payer: Self-pay

## 2022-07-17 MED ORDER — CARVEDILOL 12.5 MG PO TABS: ORAL_TABLET | ORAL | 0 refills | Status: DC

## 2022-08-28 DIAGNOSIS — Z903 Acquired absence of stomach [part of]: Secondary | ICD-10-CM | POA: Diagnosis not present

## 2022-09-12 ENCOUNTER — Other Ambulatory Visit: Payer: Self-pay | Admitting: Internal Medicine

## 2022-09-25 DIAGNOSIS — Z903 Acquired absence of stomach [part of]: Secondary | ICD-10-CM | POA: Diagnosis not present

## 2022-09-25 DIAGNOSIS — M793 Panniculitis, unspecified: Secondary | ICD-10-CM | POA: Diagnosis not present

## 2022-10-02 ENCOUNTER — Encounter: Payer: Self-pay | Admitting: Internal Medicine

## 2022-10-02 ENCOUNTER — Ambulatory Visit: Payer: BC Managed Care – PPO | Admitting: Internal Medicine

## 2022-10-02 VITALS — BP 156/78 | HR 61 | Temp 98.3°F | Ht 66.0 in | Wt 189.6 lb

## 2022-10-02 DIAGNOSIS — G8929 Other chronic pain: Secondary | ICD-10-CM

## 2022-10-02 DIAGNOSIS — Z862 Personal history of diseases of the blood and blood-forming organs and certain disorders involving the immune mechanism: Secondary | ICD-10-CM | POA: Diagnosis not present

## 2022-10-02 DIAGNOSIS — E6609 Other obesity due to excess calories: Secondary | ICD-10-CM

## 2022-10-02 DIAGNOSIS — R413 Other amnesia: Secondary | ICD-10-CM | POA: Diagnosis not present

## 2022-10-02 DIAGNOSIS — E559 Vitamin D deficiency, unspecified: Secondary | ICD-10-CM | POA: Diagnosis not present

## 2022-10-02 DIAGNOSIS — I10 Essential (primary) hypertension: Secondary | ICD-10-CM

## 2022-10-02 DIAGNOSIS — Z6830 Body mass index (BMI) 30.0-30.9, adult: Secondary | ICD-10-CM

## 2022-10-02 DIAGNOSIS — M5442 Lumbago with sciatica, left side: Secondary | ICD-10-CM | POA: Diagnosis not present

## 2022-10-02 MED ORDER — CYANOCOBALAMIN 1000 MCG/ML IJ SOLN
1000.0000 ug | Freq: Once | INTRAMUSCULAR | Status: AC
  Administered 2022-10-02: 1000 ug via INTRAMUSCULAR

## 2022-10-02 NOTE — Progress Notes (Signed)
Barnet Glasgow Martin,acting as a Education administrator for Maximino Greenland, MD.,have documented all relevant documentation on the behalf of Maximino Greenland, MD,as directed by  Maximino Greenland, MD while in the presence of Maximino Greenland, MD.    Subjective:     Patient ID: [name] [name] , female    DOB: Mar 31, 1961 , 62 y.o.   MRN: [id number]   Chief Complaint  Patient presents with   Hypertension    HPI  She is here today for a follow-up on her blood pressure.  She reports compliance with meds. She denies headaches, chest pain and shortness of breath. Patient states she has a tingling feeling in her left leg when standing and sitting for a while now. She denies recent fall/trauma. Denies urinary/fecal incontinence.  She is not sure what is triggering her sx. She states she is also having problems with memory. Patient did not take BP meds today and has been rushing today.   BP Readings from Last 3 Encounters: 10/02/22 : (!) 140/80 03/06/22 : 122/60 03/06/22 : 139/88    Hypertension This is a chronic problem. The current episode started more than 1 year ago. The problem has been gradually improving since onset. The problem is controlled. Pertinent negatives include no blurred vision, chest pain, palpitations or shortness of breath. Risk factors for coronary artery disease include obesity, post-menopausal state and sedentary lifestyle. The current treatment provides moderate improvement. Compliance problems include exercise.      Past Medical History:  Diagnosis Date   Abnormal glucose    Candidiasis    Cervicalgia    Essential hypertension 12/07/2016   Flatulence    Hypertension    Localized swelling, mass and lump, head    Lumbago    Morbid obesity ([place]) 12/07/2016   Obesity 12/07/2016   Onychomycosis      Family History  Problem Relation Age of Onset   Hypertension Mother    Heart failure Father    Diabetes Father    Heart attack Father    Breast cancer Neg Hx      Current Outpatient  Medications:    Biotin 10000 MCG TABS, Take by mouth., Disp: , Rfl:    Calcium Citrate-Vitamin D (CELEBRATE CALCIUM PLUS 500 PO), Take by mouth. 3 chewables daily, Disp: , Rfl:    Multiple Vitamins-Minerals (CELEBRATE MULTI-COMPLETE 18) CHEW, Chew by mouth. 1 per day, Disp: , Rfl:    psyllium (METAMUCIL) 58.6 % packet, Take 1 packet by mouth daily., Disp: , Rfl:    TURMERIC CURCUMIN PO, Take 1 capsule by mouth daily., Disp: , Rfl:    amLODipine (NORVASC) 5 MG tablet, Take 1 tablet (5 mg total) by mouth daily., Disp: 90 tablet, Rfl: 2   carvedilol (COREG) 12.5 MG tablet, TAKE 1 TABLET BY MOUTH TWICE DAILY ., Disp: 180 tablet, Rfl: 2   No Known Allergies   Review of Systems  Constitutional: Negative.   Eyes:  Negative for blurred vision.  Respiratory: Negative.  Negative for shortness of breath.   Cardiovascular: Negative.  Negative for chest pain and palpitations.  Musculoskeletal:  Positive for back pain.  Neurological:  Positive for numbness.  Psychiatric/Behavioral: Negative.       Today's Vitals   10/02/22 1108 10/02/22 1141  BP: (!) 140/80 (!) 156/78  Pulse: 61   Temp: 98.3 F (36.8 C)   TempSrc: Oral   Weight: 189 lb 9.6 oz (86 kg)   Height: 5' 6"$  (1.676 m)   PainSc: 0-No pain  Body mass index is 30.6 kg/m.  Wt Readings from Last 3 Encounters:  10/02/22 189 lb 9.6 oz (86 kg)  03/06/22 190 lb 12.8 oz (86.5 kg)  03/06/22 192 lb 12.8 oz (87.5 kg)    Objective:  Physical Exam Vitals and nursing note reviewed.  Constitutional:      Appearance: Normal appearance.  HENT:     Head: Normocephalic and atraumatic.     Nose:     Comments: Masked     Mouth/Throat:     Comments: Masked  Eyes:     Extraocular Movements: Extraocular movements intact.  Cardiovascular:     Rate and Rhythm: Normal rate and regular rhythm.     Heart sounds: Normal heart sounds.  Pulmonary:     Effort: Pulmonary effort is normal.     Breath sounds: Normal breath sounds.  Musculoskeletal:         General: Tenderness present.     Cervical back: Normal range of motion.     Comments: Neg straight leg test  Skin:    General: Skin is warm.  Neurological:     General: No focal deficit present.     Mental Status: She is alert.  Psychiatric:        Mood and Affect: Mood normal.        Behavior: Behavior normal.      Assessment And Plan:     1. Essential hypertension Comments: Uncontrolled. Admits she didn't take meds this morning.  She will c/w amlodipine 75m and carvedilol 12.563mtwice daily. Follow low sodium diet. - CMP14+EGFR - amLODipine (NORVASC) 5 MG tablet; Take 1 tablet (5 mg total) by mouth daily.  Dispense: 90 tablet; Refill: 2 - carvedilol (COREG) 12.5 MG tablet; TAKE 1 TABLET BY MOUTH TWICE DAILY .  Dispense: 180 tablet; Refill: 2  2. Chronic left-sided low back pain with left-sided sciatica Comments: Chronic, I will refer her to Ortho for radiographic studies and further eval. May benefit from muscle relaxer qhs prn. Nightly Mg is also an option. - Ambulatory referral to Orthopedic Surgery - Vitamin D (25 hydroxy)  3. Memory change Comments: I will refer her to Neuro and schedule her for CT brain. - TSH - CT HEAD WO CONTRAST (5MM); Future - cyanocobalamin (VITAMIN B12) injection 1,000 mcg  4. Class 1 obesity due to excess calories with serious comorbidity and body mass index (BMI) of 30.0 to 30.9 in adult Comments: She is s/p gastric sleeve in 2021. Encouraged to aim for at least 150 minutes of exercise/week.  5. History of anemia due to vitamin B12 deficiency Comments: I will check a vitamin B12 level today. - Vitamin B12 - cyanocobalamin (VITAMIN B12) injection 1,000 mcg    Patient was given opportunity to ask questions. Patient verbalized understanding of the plan and was able to repeat key elements of the plan. All questions were answered to their satisfaction.   I, RoMaximino GreenlandMD, have reviewed all documentation for this visit. The  documentation on 10/02/22 for the exam, diagnosis, procedures, and orders are all accurate and complete.   IF YOU HAVE BEEN REFERRED TO A SPECIALIST, IT MAY TAKE 1-2 WEEKS TO SCHEDULE/PROCESS THE REFERRAL. IF YOU HAVE NOT HEARD FROM US/SPECIALIST IN TWO WEEKS, PLEASE GIVE [place] CALL AT [phone].   THE PATIENT IS ENCOURAGED TO PRACTICE SOCIAL DISTANCING DUE TO THE COVID-19 PANDEMIC.

## 2022-10-02 NOTE — Patient Instructions (Addendum)
Magnesium glycinate, one capsule nightly OR Calm (powder) one tsp nightly  Hypertension, Adult Hypertension is another name for high blood pressure. High blood pressure forces your heart to work harder to pump blood. This can cause problems over time. There are two numbers in a blood pressure reading. There is a top number (systolic) over a bottom number (diastolic). It is best to have a blood pressure that is below 120/80. What are the causes? The cause of this condition is not known. Some other conditions can lead to high blood pressure. What increases the risk? Some lifestyle factors can make you more likely to develop high blood pressure: Smoking. Not getting enough exercise or physical activity. Being overweight. Having too much fat, sugar, calories, or salt (sodium) in your diet. Drinking too much alcohol. Other risk factors include: Having any of these conditions: Heart disease. Diabetes. High cholesterol. Kidney disease. Obstructive sleep apnea. Having a family history of high blood pressure and high cholesterol. Age. The risk increases with age. Stress. What are the signs or symptoms? High blood pressure may not cause symptoms. Very high blood pressure (hypertensive crisis) may cause: Headache. Fast or uneven heartbeats (palpitations). Shortness of breath. Nosebleed. Vomiting or feeling like you may vomit (nauseous). Changes in how you see. Very bad chest pain. Feeling dizzy. Seizures. How is this treated? This condition is treated by making healthy lifestyle changes, such as: Eating healthy foods. Exercising more. Drinking less alcohol. Your doctor may prescribe medicine if lifestyle changes do not help enough and if: Your top number is above 130. Your bottom number is above 80. Your personal target blood pressure may vary. Follow these instructions at home: Eating and drinking  If told, follow the DASH eating plan. To follow this plan: Fill one half of  your plate at each meal with fruits and vegetables. Fill one fourth of your plate at each meal with whole grains. Whole grains include whole-wheat pasta, brown rice, and whole-grain bread. Eat or drink low-fat dairy products, such as skim milk or low-fat yogurt. Fill one fourth of your plate at each meal with low-fat (lean) proteins. Low-fat proteins include fish, chicken without skin, eggs, beans, and tofu. Avoid fatty meat, cured and processed meat, or chicken with skin. Avoid pre-made or processed food. Limit the amount of salt in your diet to less than 1,500 mg each day. Do not drink alcohol if: Your doctor tells you not to drink. You are pregnant, may be pregnant, or are planning to become pregnant. If you drink alcohol: Limit how much you have to: 0-1 drink a day for women. 0-2 drinks a day for men. Know how much alcohol is in your drink. In the [place] one drink equals one 12 oz bottle of beer (355 mL), one 5 oz glass of wine (148 mL), or one 1 oz glass of hard liquor (44 mL). Lifestyle  Work with your doctor to stay at a healthy weight or to lose weight. Ask your doctor what the best weight is for you. Get at least 30 minutes of exercise that causes your heart to beat faster (aerobic exercise) most days of the week. This may include walking, swimming, or biking. Get at least 30 minutes of exercise that strengthens your muscles (resistance exercise) at least 3 days a week. This may include lifting weights or doing Pilates. Do not smoke or use any products that contain nicotine or tobacco. If you need help quitting, ask your doctor. Check your blood pressure at home as told by  your doctor. Keep all follow-up visits. Medicines Take over-the-counter and prescription medicines only as told by your doctor. Follow directions carefully. Do not skip doses of blood pressure medicine. The medicine does not work as well if you skip doses. Skipping doses also puts you at risk for problems. Ask  your doctor about side effects or reactions to medicines that you should watch for. Contact a doctor if: You think you are having a reaction to the medicine you are taking. You have headaches that keep coming back. You feel dizzy. You have swelling in your ankles. You have trouble with your vision. Get help right away if: You get a very bad headache. You start to feel mixed up (confused). You feel weak or numb. You feel faint. You have very bad pain in your: Chest. Belly (abdomen). You vomit more than once. You have trouble breathing. These symptoms may be an emergency. Get help right away. Call 911. Do not wait to see if the symptoms will go away. Do not drive yourself to the hospital. Summary Hypertension is another name for high blood pressure. High blood pressure forces your heart to work harder to pump blood. For most people, a normal blood pressure is less than 120/80. Making healthy choices can help lower blood pressure. If your blood pressure does not get lower with healthy choices, you may need to take medicine. This information is not intended to replace advice given to you by your health care provider. Make sure you discuss any questions you have with your health care provider. Document Revised: 05/24/2021 Document Reviewed: 05/24/2021 Elsevier Patient Education  [place].

## 2022-10-03 LAB — CMP14+EGFR
ALT: 5 IU/L (ref 0–32)
AST: 12 IU/L (ref 0–40)
Albumin/Globulin Ratio: 1.4 (ref 1.2–2.2)
Albumin: 4.3 g/dL (ref 3.8–4.9)
Alkaline Phosphatase: 59 IU/L (ref 44–121)
BUN/Creatinine Ratio: 16 (ref 9–23)
BUN: 13 mg/dL (ref 6–24)
Bilirubin Total: 0.5 mg/dL (ref 0.0–1.2)
CO2: 26 mmol/L (ref 20–29)
Calcium: 9.2 mg/dL (ref 8.7–10.2)
Chloride: 103 mmol/L (ref 96–106)
Creatinine, Ser: 0.79 mg/dL (ref 0.57–1.00)
Globulin, Total: 3 g/dL (ref 1.5–4.5)
Glucose: 73 mg/dL (ref 70–99)
Potassium: 3.8 mmol/L (ref 3.5–5.2)
Sodium: 143 mmol/L (ref 134–144)
Total Protein: 7.3 g/dL (ref 6.0–8.5)
eGFR: 86 mL/min/{1.73_m2} (ref >59)

## 2022-10-03 LAB — VITAMIN D 25 HYDROXY (VIT D DEFICIENCY, FRACTURES): Vit D, 25-Hydroxy: 82.7 ng/mL (ref 30.0–100.0)

## 2022-10-03 LAB — TSH: TSH: 1.05 u[IU]/mL (ref 0.450–4.500)

## 2022-10-03 LAB — VITAMIN B12: Vitamin B-12: 574 pg/mL (ref 232–1245)

## 2022-10-06 MED ORDER — AMLODIPINE BESYLATE 5 MG PO TABS: 5.0000 mg | ORAL_TABLET | Freq: Every day | ORAL | 2 refills | Status: DC

## 2022-10-06 MED ORDER — CARVEDILOL 12.5 MG PO TABS: ORAL_TABLET | ORAL | 2 refills | Status: DC

## 2022-10-17 ENCOUNTER — Encounter: Payer: Self-pay | Admitting: Orthopaedic Surgery

## 2022-10-17 ENCOUNTER — Ambulatory Visit (INDEPENDENT_AMBULATORY_CARE_PROVIDER_SITE_OTHER): Payer: BC Managed Care – PPO

## 2022-10-17 ENCOUNTER — Ambulatory Visit (INDEPENDENT_AMBULATORY_CARE_PROVIDER_SITE_OTHER): Payer: BC Managed Care – PPO | Admitting: Orthopaedic Surgery

## 2022-10-17 DIAGNOSIS — M545 Low back pain, unspecified: Secondary | ICD-10-CM | POA: Diagnosis not present

## 2022-10-17 MED ORDER — PREDNISONE 10 MG (21) PO TBPK: ORAL_TABLET | ORAL | 3 refills | Status: DC

## 2022-10-17 NOTE — Progress Notes (Signed)
Office Visit Note   Patient: [name] [name]           Date of Birth: September 23, 1960           MRN: [id number] Visit Date: 10/17/2022              Requested by: [name], [address] [place],  [place] [address] PCP: [name], MD   Assessment & Plan: Visit Diagnoses:  1. Low back pain, unspecified back pain laterality, unspecified chronicity, unspecified whether sciatica present     Plan: Impression is lumbar radiculopathy.  She has a fair amount of facet disease as well.  Disease process explained and treatment options were reviewed.  Will start with outpatient physical therapy and a steroid encouraged and answered.  Follow-up as needed.  Follow-Up Instructions: No follow-ups on file.   Orders:  Orders Placed This Encounter  Procedures   XR Lumbar Spine 2-3 Views   Ambulatory referral to Physical Therapy   Meds ordered this encounter  Medications   predniSONE (STERAPRED UNI-PAK 21 TAB) 10 MG (21) TBPK tablet    Sig: Take as directed    Dispense:  21 tablet    Refill:  3      Procedures: No procedures performed   Clinical Data: No additional findings.   Subjective: Chief Complaint  Patient presents with   Lower Back - Pain    HPI  [name] [name] is a pleasant 62 year old female with low back pain and left leg radiculopathy on and off for years.  Denies any recent injuries or trauma or change in activity.  Denies any groin pain.  Review of Systems  Constitutional: Negative.   HENT: Negative.    Eyes: Negative.   Respiratory: Negative.    Cardiovascular: Negative.   Endocrine: Negative.   Musculoskeletal: Negative.   Neurological: Negative.   Hematological: Negative.   Psychiatric/Behavioral: Negative.    All other systems reviewed and are negative.    Objective: Vital Signs: LMP  (LMP Unknown)   Physical Exam Vitals and nursing note reviewed.  Constitutional:      Appearance: She is well-developed.  HENT:     Head: Atraumatic.      Nose: Nose normal.  Eyes:     Extraocular Movements: Extraocular movements intact.  Cardiovascular:     Pulses: Normal pulses.  Pulmonary:     Effort: Pulmonary effort is normal.  Abdominal:     Palpations: Abdomen is soft.  Musculoskeletal:     Cervical back: Neck supple.  Skin:    General: Skin is warm.     Capillary Refill: Capillary refill takes less than 2 seconds.  Neurological:     Mental Status: She is alert. Mental status is at baseline.  Psychiatric:        Behavior: Behavior normal.        Thought Content: Thought content normal.        Judgment: Judgment normal.     Ortho Exam  Examination lumbar spine shows no abnormalities.  No focal motor or sensory deficits in the lower extremities.  Hip exam is unremarkable.  Specialty Comments:  No specialty comments available.  Imaging: XR Lumbar Spine 2-3 Views  Result Date: 10/17/2022 X-rays demonstrate diffuse lumbar spondylosis from facet disease.  Minimal spondylolisthesis that appears degenerative.  No acute abnormalities.    PMFS History: Patient Active Problem List   Diagnosis Date Noted   Edema 06/07/2019   Encntr for general adult medical exam w/o abnormal findings 05/30/2018  Essential (primary) hypertension 05/30/2018   Essential hypertension 12/07/2016   Past Medical History:  Diagnosis Date   Abnormal glucose    Candidiasis    Cervicalgia    Essential hypertension 12/07/2016   Flatulence    Hypertension    Localized swelling, mass and lump, head    Lumbago    Morbid obesity ([place]) 12/07/2016   Obesity 12/07/2016   Onychomycosis     Family History  Problem Relation Age of Onset   Hypertension Mother    Heart failure Father    Diabetes Father    Heart attack Father    Breast cancer Neg Hx     Past Surgical History:  Procedure Laterality Date   ABDOMINAL HYSTERECTOMY     LAPAROSCOPIC GASTRIC SLEEVE RESECTION  08/08/2020   Social History   Occupational History   Not on file   Tobacco Use   Smoking status: Never   Smokeless tobacco: Never  Substance and Sexual Activity   Alcohol use: Not on file   Drug use: Not on file   Sexual activity: Not on file

## 2022-10-30 ENCOUNTER — Ambulatory Visit
Admission: RE | Admit: 2022-10-30 | Discharge: 2022-10-30 | Disposition: A | Discharge status: Home / Self Care | Payer: BC Managed Care – PPO | Source: Ambulatory Visit | Attending: Internal Medicine | Admitting: Internal Medicine

## 2022-10-30 DIAGNOSIS — R413 Other amnesia: Secondary | ICD-10-CM

## 2023-02-19 ENCOUNTER — Encounter: Payer: Self-pay | Admitting: Internal Medicine

## 2023-02-19 ENCOUNTER — Ambulatory Visit: Payer: BC Managed Care – PPO | Admitting: Internal Medicine

## 2023-02-19 VITALS — BP 170/90 | HR 57 | Temp 97.6°F | Ht 66.0 in | Wt 193.0 lb

## 2023-02-19 DIAGNOSIS — I10 Essential (primary) hypertension: Secondary | ICD-10-CM

## 2023-02-19 DIAGNOSIS — E6609 Other obesity due to excess calories: Secondary | ICD-10-CM

## 2023-02-19 DIAGNOSIS — Z6831 Body mass index (BMI) 31.0-31.9, adult: Secondary | ICD-10-CM

## 2023-02-19 DIAGNOSIS — D472 Monoclonal gammopathy: Secondary | ICD-10-CM

## 2023-02-19 DIAGNOSIS — R413 Other amnesia: Secondary | ICD-10-CM

## 2023-02-19 MED ORDER — VALSARTAN 160 MG PO TABS: 160.0000 mg | ORAL_TABLET | Freq: Every day | ORAL | 11 refills | Status: DC

## 2023-02-19 NOTE — Patient Instructions (Signed)
Hypertension, Adult Hypertension is another name for high blood pressure. High blood pressure forces your heart to work harder to pump blood. This can cause problems over time. There are two numbers in a blood pressure reading. There is a top number (systolic) over a bottom number (diastolic). It is best to have a blood pressure that is below 120/80. What are the causes? The cause of this condition is not known. Some other conditions can lead to high blood pressure. What increases the risk? Some lifestyle factors can make you more likely to develop high blood pressure: Smoking. Not getting enough exercise or physical activity. Being overweight. Having too much fat, sugar, calories, or salt (sodium) in your diet. Drinking too much alcohol. Other risk factors include: Having any of these conditions: Heart disease. Diabetes. High cholesterol. Kidney disease. Obstructive sleep apnea. Having a family history of high blood pressure and high cholesterol. Age. The risk increases with age. Stress. What are the signs or symptoms? High blood pressure may not cause symptoms. Very high blood pressure (hypertensive crisis) may cause: Headache. Fast or uneven heartbeats (palpitations). Shortness of breath. Nosebleed. Vomiting or feeling like you may vomit (nauseous). Changes in how you see. Very bad chest pain. Feeling dizzy. Seizures. How is this treated? This condition is treated by making healthy lifestyle changes, such as: Eating healthy foods. Exercising more. Drinking less alcohol. Your doctor may prescribe medicine if lifestyle changes do not help enough and if: Your top number is above 130. Your bottom number is above 80. Your personal target blood pressure may vary. Follow these instructions at home: Eating and drinking  If told, follow the DASH eating plan. To follow this plan: Fill one half of your plate at each meal with fruits and vegetables. Fill one fourth of your plate  at each meal with whole grains. Whole grains include whole-wheat pasta, brown rice, and whole-grain bread. Eat or drink low-fat dairy products, such as skim milk or low-fat yogurt. Fill one fourth of your plate at each meal with low-fat (lean) proteins. Low-fat proteins include fish, chicken without skin, eggs, beans, and tofu. Avoid fatty meat, cured and processed meat, or chicken with skin. Avoid pre-made or processed food. Limit the amount of salt in your diet to less than 1,500 mg each day. Do not drink alcohol if: Your doctor tells you not to drink. You are pregnant, may be pregnant, or are planning to become pregnant. If you drink alcohol: Limit how much you have to: 0-1 drink a day for women. 0-2 drinks a day for men. Know how much alcohol is in your drink. In the [place] one drink equals one 12 oz bottle of beer (355 mL), one 5 oz glass of wine (148 mL), or one 1 oz glass of hard liquor (44 mL). Lifestyle  Work with your doctor to stay at a healthy weight or to lose weight. Ask your doctor what the best weight is for you. Get at least 30 minutes of exercise that causes your heart to beat faster (aerobic exercise) most days of the week. This may include walking, swimming, or biking. Get at least 30 minutes of exercise that strengthens your muscles (resistance exercise) at least 3 days a week. This may include lifting weights or doing Pilates. Do not smoke or use any products that contain nicotine or tobacco. If you need help quitting, ask your doctor. Check your blood pressure at home as told by your doctor. Keep all follow-up visits. Medicines Take over-the-counter and prescription medicines   only as told by your doctor. Follow directions carefully. Do not skip doses of blood pressure medicine. The medicine does not work as well if you skip doses. Skipping doses also puts you at risk for problems. Ask your doctor about side effects or reactions to medicines that you should watch  for. Contact a doctor if: You think you are having a reaction to the medicine you are taking. You have headaches that keep coming back. You feel dizzy. You have swelling in your ankles. You have trouble with your vision. Get help right away if: You get a very bad headache. You start to feel mixed up (confused). You feel weak or numb. You feel faint. You have very bad pain in your: Chest. Belly (abdomen). You vomit more than once. You have trouble breathing. These symptoms may be an emergency. Get help right away. Call 911. Do not wait to see if the symptoms will go away. Do not drive yourself to the hospital. Summary Hypertension is another name for high blood pressure. High blood pressure forces your heart to work harder to pump blood. For most people, a normal blood pressure is less than 120/80. Making healthy choices can help lower blood pressure. If your blood pressure does not get lower with healthy choices, you may need to take medicine. This information is not intended to replace advice given to you by your health care provider. Make sure you discuss any questions you have with your health care provider. Document Revised: 05/24/2021 Document Reviewed: 05/24/2021 Elsevier Patient Education  2024 [employer]  

## 2023-02-19 NOTE — Progress Notes (Signed)
I,Victoria T Deloria Lair, CMA,acting as a Neurosurgeon for Gwynneth Aliment, MD.,have documented all relevant documentation on the behalf of Gwynneth Aliment, MD,as directed by  Gwynneth Aliment, MD while in the presence of Gwynneth Aliment, MD.  Subjective:  Patient ID: [name] [name] , female    DOB: 06/27/1961 , 62 y.o.   MRN: [id number]  Chief Complaint  Patient presents with   Hypertension    HPI  Patient presents today to discuss blood pressure meds. She reports compliance with meds. She denies having any chest pain, SOB, and dizziness. She states she has been monitoring bp readings at home. She has been getting readings like 147-50s/100. She admits she has been having dull headaches recently. She agrees they are likely related to her blood pressures. Denies visual disturbances.   Hypertension This is a chronic problem. The current episode started more than 1 year ago. The problem has been gradually improving since onset. The problem is controlled. Pertinent negatives include no blurred vision, chest pain, palpitations or shortness of breath. Risk factors for coronary artery disease include obesity, post-menopausal state and sedentary lifestyle. The current treatment provides moderate improvement. Compliance problems include exercise.      Past Medical History:  Diagnosis Date   Abnormal glucose    Candidiasis    Cervicalgia    Essential hypertension 12/07/2016   Flatulence    Hypertension    Localized swelling, mass and lump, head    Lumbago    Morbid obesity (HCC) 12/07/2016   Obesity 12/07/2016   Onychomycosis      Family History  Problem Relation Age of Onset   Hypertension Mother    Heart failure Father    Diabetes Father    Heart attack Father    Breast cancer Neg Hx      Current Outpatient Medications:    amLODipine (NORVASC) 5 MG tablet, Take 1 tablet (5 mg total) by mouth daily., Disp: 90 tablet, Rfl: 2   Biotin 40981 MCG TABS, Take by mouth., Disp: , Rfl:    Calcium  Citrate-Vitamin D (CELEBRATE CALCIUM PLUS 500 PO), Take by mouth. 3 chewables daily, Disp: , Rfl:    carvedilol (COREG) 12.5 MG tablet, TAKE 1 TABLET BY MOUTH TWICE DAILY ., Disp: 180 tablet, Rfl: 2   Multiple Vitamins-Minerals (CELEBRATE MULTI-COMPLETE 18) CHEW, Chew by mouth. 1 per day, Disp: , Rfl:    psyllium (METAMUCIL) 58.6 % packet, Take 1 packet by mouth daily., Disp: , Rfl:    TURMERIC CURCUMIN PO, Take 1 capsule by mouth daily., Disp: , Rfl:    valsartan (DIOVAN) 160 MG tablet, Take 1 tablet (160 mg total) by mouth daily., Disp: 30 tablet, Rfl: 11   predniSONE (STERAPRED UNI-PAK 21 TAB) 10 MG (21) TBPK tablet, Take as directed (Patient not taking: Reported on 02/19/2023), Disp: 21 tablet, Rfl: 3   No Known Allergies   Review of Systems  Constitutional: Negative.   HENT: Negative.    Eyes:  Negative for blurred vision.  Respiratory: Negative.  Negative for shortness of breath.   Cardiovascular: Negative.  Negative for chest pain and palpitations.  Gastrointestinal: Negative.   Neurological: Negative.   Psychiatric/Behavioral: Negative.       Today's Vitals   02/19/23 1051 02/19/23 1111  BP: (!) 142/100 (!) 170/90  Pulse: (!) 57   Temp: 97.6 F (36.4 C)   SpO2: 98%   Weight: 193 lb (87.5 kg)   Height: 5\' 6"  (1.676 m)    Body mass index is  31.15 kg/m.  Wt Readings from Last 3 Encounters:  03/07/23 193 lb (87.5 kg)  02/19/23 193 lb (87.5 kg)  10/02/22 189 lb 9.6 oz (86 kg)    BP Readings from Last 3 Encounters:  03/07/23 138/88  02/19/23 (!) 170/90  10/02/22 (!) 156/78     Objective:  Physical Exam Vitals and nursing note reviewed.  Constitutional:      Appearance: Normal appearance.  HENT:     Head: Normocephalic and atraumatic.  Eyes:     Extraocular Movements: Extraocular movements intact.  Cardiovascular:     Rate and Rhythm: Normal rate and regular rhythm.     Heart sounds: Normal heart sounds.  Pulmonary:     Effort: Pulmonary effort is normal.      Breath sounds: Normal breath sounds.  Musculoskeletal:     Cervical back: Normal range of motion.  Skin:    General: Skin is warm.  Neurological:     General: No focal deficit present.     Mental Status: She is alert.  Psychiatric:        Mood and Affect: Mood normal.        Behavior: Behavior normal.         Assessment And Plan:  Essential hypertension Assessment & Plan: Uncontrolled. She will c/w carvedilol 12.5mg  twice daily and amlodipine 5mg  daily. She will start taking amlodipine in am. Start valsartan 160mg  in PM,f/u 2 wks. I will check BMP at that time.   Orders: -     CMP14+EGFR  MGUS (monoclonal gammopathy of unknown significance) Assessment & Plan: Chronic, stable.  Most recent Oncology note reviewed in detail. She is now being seen every 18 months, last visit July 2023.   Orders: -     Multiple Myeloma Panel (SPEP&IFE w/QIG)  Memory loss Assessment & Plan: 6CIT performed, she scored 6. I will refer her to Neuro as requested.    Class 1 obesity due to excess calories with serious comorbidity and body mass index (BMI) of 31.0 to 31.9 in adult Assessment & Plan: She is s/p gastric sleeve in 2021. She is encouraged to aim for at least 150 minutes of exercise/week.    Other orders -     Valsartan; Take 1 tablet (160 mg total) by mouth daily.  Dispense: 30 tablet; Refill: 11  She is encouraged to strive for BMI less than 30 to decrease cardiac risk. Advised to aim for at least 150 minutes of exercise per week.    Return in 13 days (on 03/04/2023), or TUES - NV, bp check.  Patient was given opportunity to ask questions. Patient verbalized understanding of the plan and was able to repeat key elements of the plan. All questions were answered to their satisfaction.   I, Gwynneth Aliment, MD, have reviewed all documentation for this visit. The documentation on 02/19/23 for the exam, diagnosis, procedures, and orders are all accurate and complete.   IF YOU HAVE BEEN  REFERRED TO A SPECIALIST, IT MAY TAKE 1-2 WEEKS TO SCHEDULE/PROCESS THE REFERRAL. IF YOU HAVE NOT HEARD FROM US/SPECIALIST IN TWO WEEKS, PLEASE GIVE [place] A CALL AT [phone].   THE PATIENT IS ENCOURAGED TO PRACTICE SOCIAL DISTANCING DUE TO THE COVID-19 PANDEMIC.

## 2023-02-25 LAB — CMP14+EGFR
ALT: 5 IU/L (ref 0–32)
AST: 14 IU/L (ref 0–40)
Albumin: 4.2 g/dL (ref 3.8–4.9)
Alkaline Phosphatase: 59 IU/L (ref 44–121)
BUN/Creatinine Ratio: 23 (ref 12–28)
BUN: 18 mg/dL (ref 8–27)
Bilirubin Total: 0.4 mg/dL (ref 0.0–1.2)
CO2: 26 mmol/L (ref 20–29)
Calcium: 9 mg/dL (ref 8.7–10.3)
Chloride: 103 mmol/L (ref 96–106)
Creatinine, Ser: 0.8 mg/dL (ref 0.57–1.00)
Globulin, Total: 3.2 g/dL (ref 1.5–4.5)
Glucose: 50 mg/dL — ABNORMAL LOW (ref 70–99)
Potassium: 4 mmol/L (ref 3.5–5.2)
Sodium: 143 mmol/L (ref 134–144)
Total Protein: 7.4 g/dL (ref 6.0–8.5)
eGFR: 84 mL/min/{1.73_m2} (ref >59)

## 2023-02-25 LAB — MULTIPLE MYELOMA PANEL, SERUM
Albumin SerPl Elph-Mcnc: 4 g/dL (ref 2.9–4.4)
Albumin/Glob SerPl: 1.2 (ref 0.7–1.7)
Alpha 1: 0.2 g/dL (ref 0.0–0.4)
Alpha2 Glob SerPl Elph-Mcnc: 0.7 g/dL (ref 0.4–1.0)
B-Globulin SerPl Elph-Mcnc: 0.7 g/dL (ref 0.7–1.3)
Gamma Glob SerPl Elph-Mcnc: 1.7 g/dL (ref 0.4–1.8)
Globulin, Total: 3.4 g/dL (ref 2.2–3.9)
IgA/Immunoglobulin A, Serum: 94 mg/dL (ref 87–352)
IgG (Immunoglobin G), Serum: 1602 mg/dL (ref 586–1602)
IgM (Immunoglobulin M), Srm: 80 mg/dL (ref 26–217)
M Protein SerPl Elph-Mcnc: 0.8 g/dL — ABNORMAL HIGH

## 2023-03-04 ENCOUNTER — Ambulatory Visit: Payer: BC Managed Care – PPO

## 2023-03-07 ENCOUNTER — Ambulatory Visit: Payer: BC Managed Care – PPO

## 2023-03-07 VITALS — BP 138/88 | HR 98 | Temp 98.5°F | Ht 66.0 in | Wt 193.0 lb

## 2023-03-07 DIAGNOSIS — I10 Essential (primary) hypertension: Secondary | ICD-10-CM

## 2023-03-07 NOTE — Progress Notes (Signed)
Patient presents today for a BP check , patient currently taking amLODipine 5mg  PM, carvedilol 12.5mg  1 tablet AM and PM, valsartan 160mg  PM. BP Readings from Last 3 Encounters:  03/07/23 (!) 152/90  02/19/23 (!) 170/90  10/02/22 (!) 156/78   Per provider- exercise more and take amLODipine 5mg s in the AM! F/u 2 weeks.

## 2023-03-12 ENCOUNTER — Encounter: Payer: BC Managed Care – PPO | Admitting: Internal Medicine

## 2023-03-13 DIAGNOSIS — E6609 Other obesity due to excess calories: Secondary | ICD-10-CM | POA: Insufficient documentation

## 2023-03-13 DIAGNOSIS — D472 Monoclonal gammopathy: Secondary | ICD-10-CM | POA: Insufficient documentation

## 2023-03-13 DIAGNOSIS — R413 Other amnesia: Secondary | ICD-10-CM | POA: Insufficient documentation

## 2023-03-13 NOTE — Assessment & Plan Note (Addendum)
She is s/p gastric sleeve in 2021. She is encouraged to aim for at least 150 minutes of exercise/week.

## 2023-03-13 NOTE — Assessment & Plan Note (Addendum)
Chronic, stable.  Most recent Oncology note reviewed in detail. She is now being seen every 18 months, last visit July 2023.

## 2023-03-13 NOTE — Assessment & Plan Note (Addendum)
6CIT performed, she scored 6. I will refer her to Neuro as requested.

## 2023-03-13 NOTE — Assessment & Plan Note (Signed)
Uncontrolled. She will c/w carvedilol 12.5mg  twice daily and amlodipine 5mg  daily. She will start taking amlodipine in am. Start valsartan 160mg  in PM,f/u 2 wks. I will check BMP at that time.

## 2023-03-18 ENCOUNTER — Encounter: Payer: BC Managed Care – PPO | Admitting: Internal Medicine

## 2023-03-25 ENCOUNTER — Ambulatory Visit: Payer: BC Managed Care – PPO

## 2023-03-25 VITALS — BP 140/92 | HR 61 | Temp 98.1°F | Ht 66.0 in | Wt 193.0 lb

## 2023-03-25 DIAGNOSIS — I10 Essential (primary) hypertension: Secondary | ICD-10-CM

## 2023-03-25 NOTE — Progress Notes (Signed)
Patient presents today for a bp check. She is currently taking the carvedilol 12.5mg  and valsartan 160mg  in the mornings and amlodipine 5mg  and carvedilol 12.5mg  in the evenings. She reported she has been trying to increase exercising throughout the week. I waited and 10 minutes to recheck her bp and it was 140/92 p61. Provider advised patient to increase her amlodipine 5mg  to one and a half tablets in the evenings. She is to keep her next appointment as scheduled with provider.    BP Readings from Last 3 Encounters:  03/07/23 138/88  02/19/23 (!) 170/90  10/02/22 (!) 156/78

## 2023-04-15 ENCOUNTER — Encounter: Payer: BC Managed Care – PPO | Admitting: Internal Medicine

## 2023-04-15 NOTE — Patient Instructions (Signed)

## 2023-04-16 ENCOUNTER — Ambulatory Visit (INDEPENDENT_AMBULATORY_CARE_PROVIDER_SITE_OTHER): Payer: BC Managed Care – PPO | Admitting: Internal Medicine

## 2023-04-16 VITALS — BP 140/90 | HR 58 | Temp 98.3°F | Ht 66.0 in | Wt 192.8 lb

## 2023-04-16 DIAGNOSIS — E6609 Other obesity due to excess calories: Secondary | ICD-10-CM

## 2023-04-16 DIAGNOSIS — Z0000 Encounter for general adult medical examination without abnormal findings: Secondary | ICD-10-CM

## 2023-04-16 DIAGNOSIS — Z0001 Encounter for general adult medical examination with abnormal findings: Secondary | ICD-10-CM

## 2023-04-16 DIAGNOSIS — Z6831 Body mass index (BMI) 31.0-31.9, adult: Secondary | ICD-10-CM

## 2023-04-16 DIAGNOSIS — R413 Other amnesia: Secondary | ICD-10-CM

## 2023-04-16 DIAGNOSIS — I10 Essential (primary) hypertension: Secondary | ICD-10-CM

## 2023-04-16 DIAGNOSIS — D472 Monoclonal gammopathy: Secondary | ICD-10-CM

## 2023-04-16 DIAGNOSIS — R202 Paresthesia of skin: Secondary | ICD-10-CM | POA: Diagnosis not present

## 2023-04-16 DIAGNOSIS — M79609 Pain in unspecified limb: Secondary | ICD-10-CM | POA: Diagnosis not present

## 2023-04-16 LAB — POCT URINALYSIS DIPSTICK
Bilirubin, UA: NEGATIVE
Blood, UA: NEGATIVE
Glucose, UA: NEGATIVE
Ketones, UA: NEGATIVE
Leukocytes, UA: NEGATIVE
Nitrite, UA: NEGATIVE
Protein, UA: NEGATIVE
Spec Grav, UA: 1.025 (ref 1.010–1.025)
Urobilinogen, UA: 0.2 E.U./dL
pH, UA: 7 (ref 5.0–8.0)

## 2023-04-16 NOTE — Assessment & Plan Note (Signed)

## 2023-04-16 NOTE — Progress Notes (Signed)
I,Victoria T Deloria Lair, CMA,acting as a Neurosurgeon for Gwynneth Aliment, MD.,have documented all relevant documentation on the behalf of Gwynneth Aliment, MD,as directed by  Gwynneth Aliment, MD while in the presence of Gwynneth Aliment, MD.  Subjective:    Patient ID: [name] [name] , female    DOB: [birth year] , 62 y.o.   MRN: [id number]  Chief Complaint  Patient presents with   Annual Exam   Hypertension    HPI  She is here today for a full physical examination.  She is not followed by GYN. She is s/p hysterectomy.  She reports compliance with meds. She denies headaches, chest pain and shortness of breath.     Hypertension This is a chronic problem. The current episode started more than 1 year ago. The problem has been gradually improving since onset. The problem is controlled. Pertinent negatives include no blurred vision, chest pain, palpitations or shortness of breath. Risk factors for coronary artery disease include obesity, post-menopausal state and sedentary lifestyle. The current treatment provides moderate improvement. Compliance problems include exercise.      Past Medical History:  Diagnosis Date   Abnormal glucose    Candidiasis    Cervicalgia    Essential hypertension 12/07/2016   Flatulence    Hypertension    Localized swelling, mass and lump, head    Lumbago    Morbid obesity (HCC) 12/07/2016   Obesity 12/07/2016   Onychomycosis      Family History  Problem Relation Age of Onset   Hypertension Mother    Heart failure Father    Diabetes Father    Heart attack Father    Breast cancer Neg Hx      Current Outpatient Medications:    amLODipine (NORVASC) 5 MG tablet, Take 1 tablet (5 mg total) by mouth daily., Disp: 90 tablet, Rfl: 2   Biotin 78295 MCG TABS, Take by mouth., Disp: , Rfl:    Calcium Citrate-Vitamin D (CELEBRATE CALCIUM PLUS 500 PO), Take by mouth. 3 chewables daily, Disp: , Rfl:    carvedilol (COREG) 12.5 MG tablet, TAKE 1 TABLET BY MOUTH TWICE DAILY .,  Disp: 180 tablet, Rfl: 2   Multiple Vitamins-Minerals (CELEBRATE MULTI-COMPLETE 18) CHEW, Chew by mouth. 1 per day, Disp: , Rfl:    psyllium (METAMUCIL) 58.6 % packet, Take 1 packet by mouth daily., Disp: , Rfl:    TURMERIC CURCUMIN PO, Take 1 capsule by mouth daily., Disp: , Rfl:    valsartan (DIOVAN) 160 MG tablet, Take 1 tablet (160 mg total) by mouth daily., Disp: 30 tablet, Rfl: 11   No Known Allergies    The patient states she uses post menopausal status for birth control. No LMP recorded (lmp unknown). Patient has had a hysterectomy.. Negative for Dysmenorrhea. Negative for: breast discharge, breast lump(s), breast pain and breast self exam. Associated symptoms include abnormal vaginal bleeding. Pertinent negatives include abnormal bleeding (hematology), anxiety, decreased libido, depression, difficulty falling sleep, dyspareunia, history of infertility, nocturia, sexual dysfunction, sleep disturbances, urinary incontinence, urinary urgency, vaginal discharge and vaginal itching. Diet regular.The patient states her exercise level is  2-3 days/week.   . The patient's tobacco use is:  Social History   Tobacco Use  Smoking Status Never  Smokeless Tobacco Never  . She has been exposed to passive smoke. The patient's alcohol use is:  Social History   Substance and Sexual Activity  Alcohol Use None    Review of Systems  Constitutional: Negative.   HENT: Negative.  Eyes: Negative.  Negative for blurred vision.  Respiratory: Negative.  Negative for shortness of breath.   Cardiovascular: Negative.  Negative for chest pain and palpitations.  Gastrointestinal: Negative.   Endocrine: Negative.   Genitourinary: Negative.   Musculoskeletal: Negative.   Skin: Negative.   Allergic/Immunologic: Negative.   Neurological: Negative.        She c/o memory issues.  Hematological: Negative.   Psychiatric/Behavioral: Negative.       Today's Vitals   04/16/23 1459 04/16/23 1524  BP: (!)  130/100 (!) 140/90  Pulse: (!) 58   Temp: 98.3 F (36.8 C)   SpO2: 97%   Weight: 192 lb 12.8 oz (87.5 kg)   Height: 5\' 6"  (1.676 m)    Body mass index is 31.12 kg/m.  Wt Readings from Last 3 Encounters:  04/16/23 192 lb 12.8 oz (87.5 kg)  03/25/23 193 lb (87.5 kg)  03/07/23 193 lb (87.5 kg)     Objective:  Physical Exam Vitals and nursing note reviewed.  Constitutional:      Appearance: Normal appearance.  HENT:     Head: Normocephalic and atraumatic.     Right Ear: Tympanic membrane, ear canal and external ear normal.     Left Ear: Tympanic membrane, ear canal and external ear normal.     Nose: Nose normal.     Mouth/Throat:     Mouth: Mucous membranes are moist.     Pharynx: Oropharynx is clear.  Eyes:     Extraocular Movements: Extraocular movements intact.     Conjunctiva/sclera: Conjunctivae normal.     Pupils: Pupils are equal, round, and reactive to light.  Cardiovascular:     Rate and Rhythm: Normal rate and regular rhythm.     Pulses: Normal pulses.     Heart sounds: Normal heart sounds.  Pulmonary:     Effort: Pulmonary effort is normal.     Breath sounds: Normal breath sounds.  Chest:  Breasts:    Tanner Score is 5.     Right: Normal.     Left: Normal.  Abdominal:     General: Abdomen is flat. Bowel sounds are normal.     Palpations: Abdomen is soft.  Genitourinary:    Comments: deferred Musculoskeletal:        General: Normal range of motion.     Cervical back: Normal range of motion and neck supple.  Skin:    General: Skin is warm and dry.  Neurological:     General: No focal deficit present.     Mental Status: She is alert and oriented to person, place, and time.  Psychiatric:        Mood and Affect: Mood normal.        Behavior: Behavior normal.         Assessment And Plan:     Annual physical exam Assessment & Plan: A full exam was performed.  Importance of monthly self breast exams was discussed with the patient.  She is advised to  get 30-45 minutes of regular exercise, no less than four to five days per week. Both weight-bearing and aerobic exercises are recommended.  She is advised to follow a healthy diet with at least six fruits/veggies per day, decrease intake of red meat and other saturated fats and to increase fish intake to twice weekly.  Meats/fish should not be fried -- baked, boiled or broiled is preferable. It is also important to cut back on your sugar intake.  Be sure to read labels -  try to avoid anything with added sugar, high fructose corn syrup or other sweeteners.  If you must use a sweetener, you can try stevia or monkfruit.  It is also important to avoid artificially sweetened foods/beverages and diet drinks. Lastly, wear SPF 50 sunscreen on exposed skin and when in direct sunlight for an extended period of time.  Be sure to avoid fast food restaurants and aim for at least 60 ounces of water daily.      Orders: -     CBC -     Lipid panel -     Hemoglobin A1c  Essential hypertension Assessment & Plan: Chronic, uncontrolled.  EKG performed, NSR w/ nonspecific T abnormality.  I will increase amlodipine from 5mg  1-1/2 tabs to 2 tabs nightly. I will send new rx amlodipine 10mg  daily. She agrees to f/u in 3 weeks for NV. Reminded to follow low sodium diet.   Orders: -     POCT urinalysis dipstick -     Microalbumin / creatinine urine ratio -     EKG 12-Lead  Memory loss Assessment & Plan: She scored 6 on 6CIT> I will refer her to Neuro as requested.    Paresthesia and pain of extremity -     Ambulatory referral to Neurology -     ANA, IFA (with reflex)  Class 1 obesity due to excess calories with serious comorbidity and body mass index (BMI) of 31.0 to 31.9 in adult Assessment & Plan: She is s/p sleeve gastrectomy in 2021. She is encouraged to strive for BMI less than 30 to decrease cardiac risk. Advised to aim for at least 150 minutes of exercise per week.    She is encouraged to strive for BMI  less than 30 to decrease cardiac risk. Advised to aim for at least 150 minutes of exercise per week.    Return in 20 days (on 05/06/2023), or NV  - bp check, for 1 year HM, 4 Mon Bp f/u. Patient was given opportunity to ask questions. Patient verbalized understanding of the plan and was able to repeat key elements of the plan. All questions were answered to their satisfaction.     I, Gwynneth Aliment, MD, have reviewed all documentation for this visit. The documentation on 04/16/23 for the exam, diagnosis, procedures, and orders are all accurate and complete.

## 2023-04-22 LAB — MICROALBUMIN / CREATININE URINE RATIO
Creatinine, Urine: 125.3 mg/dL
Microalb/Creat Ratio: 8 mg/g{creat} (ref 0–29)
Microalbumin, Urine: 10.2 ug/mL

## 2023-04-22 LAB — CBC
Hematocrit: 33.6 % — ABNORMAL LOW (ref 34.0–46.6)
Hemoglobin: 11 g/dL — ABNORMAL LOW (ref 11.1–15.9)
MCH: 28.3 pg (ref 26.6–33.0)
MCHC: 32.7 g/dL (ref 31.5–35.7)
MCV: 86 fL (ref 79–97)
Platelets: 181 10*3/uL (ref 150–450)
RBC: 3.89 x10E6/uL (ref 3.77–5.28)
RDW: 14.3 % (ref 11.7–15.4)
WBC: 4.9 10*3/uL (ref 3.4–10.8)

## 2023-04-22 LAB — LIPID PANEL
Chol/HDL Ratio: 2.7 ratio (ref 0.0–4.4)
Cholesterol, Total: 171 mg/dL (ref 100–199)
HDL: 64 mg/dL (ref >39)
LDL Chol Calc (NIH): 97 mg/dL (ref 0–99)
Triglycerides: 48 mg/dL (ref 0–149)
VLDL Cholesterol Cal: 10 mg/dL (ref 5–40)

## 2023-04-22 LAB — HEMOGLOBIN A1C
Est. average glucose Bld gHb Est-mCnc: 108 mg/dL
Hgb A1c MFr Bld: 5.4 % (ref 4.8–5.6)

## 2023-04-22 LAB — ANTINUCLEAR ANTIBODIES, IFA: ANA Titer 1: NEGATIVE

## 2023-04-27 NOTE — Assessment & Plan Note (Signed)
She scored 6 on 6CIT> I will refer her to Neuro as requested.

## 2023-04-27 NOTE — Assessment & Plan Note (Signed)
Chronic, uncontrolled.  EKG performed, NSR w/ nonspecific T abnormality.  I will increase amlodipine from 5mg  1-1/2 tabs to 2 tabs nightly. I will send new rx amlodipine 10mg  daily. She agrees to f/u in 3 weeks for NV. Reminded to follow low sodium diet.

## 2023-04-27 NOTE — Assessment & Plan Note (Signed)
She is s/p sleeve gastrectomy in 2021. She is encouraged to strive for BMI less than 30 to decrease cardiac risk. Advised to aim for at least 150 minutes of exercise per week.

## 2023-04-28 ENCOUNTER — Other Ambulatory Visit: Payer: Self-pay | Admitting: Internal Medicine

## 2023-04-28 DIAGNOSIS — D509 Iron deficiency anemia, unspecified: Secondary | ICD-10-CM

## 2023-04-30 ENCOUNTER — Ambulatory Visit: Payer: BC Managed Care – PPO | Admitting: Physician Assistant

## 2023-05-06 ENCOUNTER — Other Ambulatory Visit: Payer: Self-pay | Admitting: Internal Medicine

## 2023-05-06 ENCOUNTER — Ambulatory Visit: Payer: BC Managed Care – PPO

## 2023-05-06 VITALS — BP 130/82 | HR 64 | Temp 98.4°F | Ht 66.0 in | Wt 192.0 lb

## 2023-05-06 DIAGNOSIS — I10 Essential (primary) hypertension: Secondary | ICD-10-CM

## 2023-05-06 DIAGNOSIS — D509 Iron deficiency anemia, unspecified: Secondary | ICD-10-CM

## 2023-05-06 MED ORDER — AMLODIPINE BESYLATE 10 MG PO TABS
10.0000 mg | ORAL_TABLET | Freq: Every day | ORAL | 11 refills | Status: DC
Start: 2023-05-06 — End: 2023-12-31

## 2023-05-06 NOTE — Progress Notes (Signed)
Patient presents today for a bp check. Patient reports she taking the valsartan 160mg  and carvedilol 12.5mg  in the mornings and carvedilol 12.5mg , amlodipine 10mg  in the evenings. I checked her blood pressure and it was 130/82 P64. Patient notified her goal bp is less than 120/80. Pt ws advised she needs to be exercising at least 150 minutes per week. Patient stated she is not exercising but will try to start working out.    BP Readings from Last 3 Encounters:  04/16/23 (!) 140/90  03/25/23 (!) 140/92  03/07/23 138/88

## 2023-05-07 LAB — IRON,TIBC AND FERRITIN PANEL
Ferritin: 484 ng/mL — ABNORMAL HIGH (ref 15–150)
Iron Saturation: 29 % (ref 15–55)
Iron: 76 ug/dL (ref 27–159)
Total Iron Binding Capacity: 262 ug/dL (ref 250–450)
UIBC: 186 ug/dL (ref 131–425)

## 2023-05-09 ENCOUNTER — Encounter: Payer: Self-pay | Admitting: Family Medicine

## 2023-05-09 ENCOUNTER — Ambulatory Visit: Payer: BC Managed Care – PPO | Admitting: Family Medicine

## 2023-05-09 VITALS — BP 128/84 | HR 52 | Temp 98.5°F | Ht 66.0 in | Wt 192.0 lb

## 2023-05-09 DIAGNOSIS — Z2821 Immunization not carried out because of patient refusal: Secondary | ICD-10-CM | POA: Diagnosis not present

## 2023-05-09 DIAGNOSIS — K5909 Other constipation: Secondary | ICD-10-CM

## 2023-05-09 DIAGNOSIS — E6609 Other obesity due to excess calories: Secondary | ICD-10-CM

## 2023-05-09 DIAGNOSIS — K649 Unspecified hemorrhoids: Secondary | ICD-10-CM | POA: Diagnosis not present

## 2023-05-09 DIAGNOSIS — K921 Melena: Secondary | ICD-10-CM | POA: Diagnosis not present

## 2023-05-09 LAB — POC HEMOCCULT BLD/STL (OFFICE/1-CARD/DIAGNOSTIC): Fecal Occult Blood, POC: POSITIVE — AB

## 2023-05-09 MED ORDER — POLYETHYLENE GLYCOL 3350 17 G PO PACK: 17.0000 g | PACK | Freq: Every day | ORAL | 0 refills | Status: AC

## 2023-05-09 MED ORDER — HYDROCORTISONE (PERIANAL) 2.5 % EX CREA: 1.0000 | TOPICAL_CREAM | Freq: Two times a day (BID) | CUTANEOUS | 0 refills | Status: AC

## 2023-05-09 NOTE — Progress Notes (Unsigned)
Madelaine Bhat, CMA,acting as a Neurosurgeon for [name], NP.,have documented all relevant documentation on the behalf of [name], NP,as directed by  [name], NP while in the presence of [name], NP.  Subjective:  Patient ID: [name] [name] , female    DOB: Oct 27, 1960 , 62 y.o.   MRN: [id number]  Chief Complaint  Patient presents with   Rectal Bleeding    HPI  Patient presents today for blood in stool. Patient reports she has been noticing blood in her stool in her the past few months. She reports she noticed it more yesterday but it was no stool in the toilet. She reports she had a bowel movement later in the day with no blood. Patient reports she did have a little blood on tissues when wiping. Patient reports she does have a hemorrhoids. BP Readings from Last 3 Encounters: 05/09/23 : 128/84 05/06/23 : 130/82 04/16/23 : (!) 140/90       Past Medical History:  Diagnosis Date   Abnormal glucose    Candidiasis    Cervicalgia    Essential hypertension 12/07/2016   Flatulence    Hypertension    Localized swelling, mass and lump, head    Lumbago    Morbid obesity (HCC) 12/07/2016   Obesity 12/07/2016   Onychomycosis      Family History  Problem Relation Age of Onset   Hypertension Mother    Heart failure Father    Diabetes Father    Heart attack Father    Breast cancer Neg Hx      Current Outpatient Medications:    hydrocortisone (ANUSOL-HC) 2.5 % rectal cream, Place 1 Application rectally 2 (two) times daily., Disp: 30 g, Rfl: 0   polyethylene glycol (MIRALAX) 17 g packet, Take 17 g by mouth daily., Disp: 14 each, Rfl: 0   amLODipine (NORVASC) 10 MG tablet, Take 1 tablet (10 mg total) by mouth daily., Disp: 30 tablet, Rfl: 11   amLODipine (NORVASC) 5 MG tablet, Take 1 tablet (5 mg total) by mouth daily., Disp: 90 tablet, Rfl: 2   Biotin 13244 MCG TABS, Take by mouth., Disp: , Rfl:    Calcium Citrate-Vitamin D (CELEBRATE CALCIUM PLUS 500 PO), Take by mouth. 3  chewables daily, Disp: , Rfl:    carvedilol (COREG) 12.5 MG tablet, TAKE 1 TABLET BY MOUTH TWICE DAILY ., Disp: 180 tablet, Rfl: 2   Multiple Vitamins-Minerals (CELEBRATE MULTI-COMPLETE 18) CHEW, Chew by mouth. 1 per day, Disp: , Rfl:    psyllium (METAMUCIL) 58.6 % packet, Take 1 packet by mouth daily., Disp: , Rfl:    TURMERIC CURCUMIN PO, Take 1 capsule by mouth daily., Disp: , Rfl:    valsartan (DIOVAN) 160 MG tablet, Take 1 tablet (160 mg total) by mouth daily., Disp: 30 tablet, Rfl: 11   No Known Allergies   Review of Systems  Constitutional: Negative.   HENT: Negative.    Respiratory: Negative.    Cardiovascular: Negative.   Gastrointestinal:  Positive for anal bleeding, blood in stool, constipation and hematochezia.  Genitourinary: Negative.      Today's Vitals   05/09/23 0904  BP: 128/84  Pulse: (!) 52  Temp: 98.5 F (36.9 C)  TempSrc: Oral  Weight: 192 lb (87.1 kg)  Height: 5\' 6"  (1.676 m)  PainSc: 0-No pain   Body mass index is 30.99 kg/m.  Wt Readings from Last 3 Encounters:  05/09/23 192 lb (87.1 kg)  05/06/23 192 lb (87.1 kg)  04/16/23 192 lb 12.8  oz (87.5 kg)    The 10-year ASCVD risk score (Arnett DK, et al., 2019) is: 5.4%   Values used to calculate the score:     Age: 35 years     Sex: Female     Is Non-Hispanic African American: Yes     Diabetic: No     Tobacco smoker: No     Systolic Blood Pressure: 128 mmHg     Is BP treated: Yes     HDL Cholesterol: 64 mg/dL     Total Cholesterol: 171 mg/dL  Objective:  Physical Exam Cardiovascular:     Rate and Rhythm: Normal rate and regular rhythm.  Genitourinary:    Rectum: Guaiac result positive.  Neurological:     Mental Status: She is alert.         Assessment And Plan:  Blood in stool -     Ambulatory referral to Gastroenterology  Class 1 obesity due to excess calories with serious comorbidity and body mass index (BMI) of 30.0 to 30.9 in adult  Influenza vaccination declined  COVID-19  vaccination declined  Hemorrhoids, unspecified hemorrhoid type -     Hydrocortisone (Perianal); Place 1 Application rectally 2 (two) times daily.  Dispense: 30 g; Refill: 0  Other constipation -     Polyethylene Glycol 3350; Take 17 g by mouth daily.  Dispense: 14 each; Refill: 0    Return for keep same appt..  Patient was given opportunity to ask questions. Patient verbalized understanding of the plan and was able to repeat key elements of the plan. All questions were answered to their satisfaction.    I, [name], NP, have reviewed all documentation for this visit. The documentation on 05/09/23 for the exam, diagnosis, procedures, and orders are all accurate and complete.     IF YOU HAVE BEEN REFERRED TO A SPECIALIST, IT MAY TAKE 1-2 WEEKS TO SCHEDULE/PROCESS THE REFERRAL. IF YOU HAVE NOT HEARD FROM US/SPECIALIST IN TWO WEEKS, PLEASE GIVE [place] A CALL AT [phone] X [phone].

## 2023-05-13 ENCOUNTER — Other Ambulatory Visit: Payer: Self-pay

## 2023-05-13 ENCOUNTER — Telehealth: Payer: Self-pay | Admitting: Physician Assistant

## 2023-05-13 MED ORDER — SCOPOLAMINE 1 MG/3DAYS TD PT72: 1.0000 | MEDICATED_PATCH | TRANSDERMAL | 0 refills | Status: DC

## 2023-05-13 NOTE — Telephone Encounter (Signed)
Patient called in to reschedule 09/25 to 10/04, appointment has been rescheduled. Patient will be reminded per my chart.

## 2023-05-14 ENCOUNTER — Inpatient Hospital Stay: Payer: BC Managed Care – PPO | Admitting: Physician Assistant

## 2023-05-14 DIAGNOSIS — K921 Melena: Secondary | ICD-10-CM | POA: Insufficient documentation

## 2023-05-14 DIAGNOSIS — K649 Unspecified hemorrhoids: Secondary | ICD-10-CM | POA: Insufficient documentation

## 2023-05-14 DIAGNOSIS — K5909 Other constipation: Secondary | ICD-10-CM | POA: Insufficient documentation

## 2023-05-22 ENCOUNTER — Other Ambulatory Visit: Payer: Self-pay | Admitting: Physician Assistant

## 2023-05-22 DIAGNOSIS — D472 Monoclonal gammopathy: Secondary | ICD-10-CM

## 2023-05-23 ENCOUNTER — Inpatient Hospital Stay: Payer: BC Managed Care – PPO

## 2023-05-23 ENCOUNTER — Inpatient Hospital Stay: Payer: BC Managed Care – PPO | Attending: Physician Assistant | Admitting: Physician Assistant

## 2023-05-23 VITALS — BP 145/90 | HR 56 | Temp 98.1°F | Resp 17 | Wt 195.6 lb

## 2023-05-23 DIAGNOSIS — M899 Disorder of bone, unspecified: Secondary | ICD-10-CM | POA: Diagnosis not present

## 2023-05-23 DIAGNOSIS — D472 Monoclonal gammopathy: Secondary | ICD-10-CM | POA: Diagnosis not present

## 2023-05-23 DIAGNOSIS — I7300 Raynaud's syndrome without gangrene: Secondary | ICD-10-CM | POA: Diagnosis not present

## 2023-05-23 LAB — CBC WITH DIFFERENTIAL (CANCER CENTER ONLY)
Abs Immature Granulocytes: 0.01 10*3/uL (ref 0.00–0.07)
Basophils Absolute: 0 10*3/uL (ref 0.0–0.1)
Basophils Relative: 1 %
Eosinophils Absolute: 0.1 10*3/uL (ref 0.0–0.5)
Eosinophils Relative: 2 %
HCT: 37.7 % (ref 36.0–46.0)
Hemoglobin: 12 g/dL (ref 12.0–15.0)
Immature Granulocytes: 0 %
Lymphocytes Relative: 38 %
Lymphs Abs: 1.5 10*3/uL (ref 0.7–4.0)
MCH: 28 pg (ref 26.0–34.0)
MCHC: 31.8 g/dL (ref 30.0–36.0)
MCV: 87.9 fL (ref 80.0–100.0)
Monocytes Absolute: 0.3 10*3/uL (ref 0.1–1.0)
Monocytes Relative: 8 %
Neutro Abs: 2 10*3/uL (ref 1.7–7.7)
Neutrophils Relative %: 51 %
Platelet Count: 194 10*3/uL (ref 150–400)
RBC: 4.29 MIL/uL (ref 3.87–5.11)
RDW: 15.8 % — ABNORMAL HIGH (ref 11.5–15.5)
WBC Count: 3.9 10*3/uL — ABNORMAL LOW (ref 4.0–10.5)
nRBC: 0 % (ref 0.0–0.2)

## 2023-05-23 LAB — CMP (CANCER CENTER ONLY)
ALT: 6 U/L (ref 0–44)
AST: 18 U/L (ref 15–41)
Albumin: 4.4 g/dL (ref 3.5–5.0)
Alkaline Phosphatase: 51 U/L (ref 38–126)
Anion gap: 6 (ref 5–15)
BUN: 15 mg/dL (ref 6–20)
CO2: 31 mmol/L (ref 22–32)
Calcium: 9.4 mg/dL (ref 8.9–10.3)
Chloride: 104 mmol/L (ref 98–111)
Creatinine: 0.78 mg/dL (ref 0.44–1.00)
GFR, Estimated: >60 mL/min (ref >60)
Glucose, Bld: 78 mg/dL (ref 70–99)
Potassium: 3.5 mmol/L (ref 3.5–5.1)
Sodium: 141 mmol/L (ref 135–145)
Total Bilirubin: 0.6 mg/dL (ref 0.3–1.2)
Total Protein: 7.8 g/dL (ref 6.5–8.1)

## 2023-05-23 NOTE — Progress Notes (Signed)
[facility] [phone]   [phone]  PROGRESS NOTE  Patient Care Team: Dorothyann Peng, MD as PCP - General (Internal Medicine)  CHIEF COMPLAINTS/PURPOSE OF CONSULTATION:  IgG kappa MGUS  HISTORY OF PRESENTING ILLNESS:  [name] [name] 62 y.o. female returns for a follow up for IgG kappa MGUS. She was last seen by Dr. Clelia Croft on 03/06/2022. In the interim, she denies any changes to her health.  On exam today, [name] [name] reports her energy and appetite are stable. She denies nausea, vomiting or abdominal pain. She reports having blood in her stool secondary to hemorrhoids and straining. She is taking miralax daily. She is planning to undergo a colonoscopy soon. She adds that she was recently diagnosed with Raynaud's and is scheduled for a consultation with a neurologist soon. She denies fevers, chills, sweats, shortness of breath, chest pain or cough. She has no other complaints. Rest of the ROS is below.   MEDICAL HISTORY:  Past Medical History:  Diagnosis Date   Abnormal glucose    Candidiasis    Cervicalgia    Essential hypertension 12/07/2016   Flatulence    Hypertension    Localized swelling, mass and lump, head    Lumbago    Morbid obesity (HCC) 12/07/2016   Obesity 12/07/2016   Onychomycosis     SURGICAL HISTORY: Past Surgical History:  Procedure Laterality Date   ABDOMINAL HYSTERECTOMY     LAPAROSCOPIC GASTRIC SLEEVE RESECTION  08/08/2020    SOCIAL HISTORY: Social History   Socioeconomic History   Marital status: Married    Spouse name: Not on file   Number of children: Not on file   Years of education: Not on file   Highest education level: Associate degree: academic program  Occupational History   Not on file  Tobacco Use   Smoking status: Never   Smokeless tobacco: Never  Substance and Sexual Activity   Alcohol use: Not on file   Drug use: Not on file   Sexual activity: Not on file  Other Topics Concern   Not on  file  Social History Narrative   Not on file   Social Determinants of Health   Financial Resource Strain: Low Risk  (04/16/2023)   Overall Financial Resource Strain (CARDIA)    Difficulty of Paying Living Expenses: Not very hard  Food Insecurity: No Food Insecurity (04/16/2023)   Hunger Vital Sign    Worried About Running Out of Food in the Last Year: Never true    Ran Out of Food in the Last Year: Never true  Transportation Needs: No Transportation Needs (04/16/2023)   PRAPARE - Administrator, Civil Service (Medical): No    Lack of Transportation (Non-Medical): No  Physical Activity: Insufficiently Active (04/16/2023)   Exercise Vital Sign    Days of Exercise per Week: 3 days    Minutes of Exercise per Session: 30 min  Stress: No Stress Concern Present (04/16/2023)   [employer] of Occupational Health - Occupational Stress Questionnaire    Feeling of Stress : Only a little  Social Connections: Socially Integrated (04/16/2023)   Social Connection and Isolation Panel [NHANES]    Frequency of Communication with Friends and Family: More than three times a week    Frequency of Social Gatherings with Friends and Family: Once a week    Attends Religious Services: More than 4 times per year    Active Member of [employer] or Organizations: Yes    Attends Banker Meetings:  More than 4 times per year    Marital Status: Married  Catering manager Violence: Not on file    FAMILY HISTORY: Family History  Problem Relation Age of Onset   Hypertension Mother    Heart failure Father    Diabetes Father    Heart attack Father    Breast cancer Neg Hx     ALLERGIES:  has No Known Allergies.  MEDICATIONS:  Current Outpatient Medications  Medication Sig Dispense Refill   amLODipine (NORVASC) 10 MG tablet Take 1 tablet (10 mg total) by mouth daily. 30 tablet 11   Biotin 16109 MCG TABS Take by mouth.     Calcium Citrate-Vitamin D (CELEBRATE CALCIUM PLUS 500 PO) Take by  mouth. 3 chewables daily     carvedilol (COREG) 12.5 MG tablet TAKE 1 TABLET BY MOUTH TWICE DAILY . 180 tablet 2   hydrocortisone (ANUSOL-HC) 2.5 % rectal cream Place 1 Application rectally 2 (two) times daily. 30 g 0   Multiple Vitamins-Minerals (CELEBRATE MULTI-COMPLETE 18) CHEW Chew by mouth. 1 per day     polyethylene glycol (MIRALAX) 17 g packet Take 17 g by mouth daily. 14 each 0   psyllium (METAMUCIL) 58.6 % packet Take 1 packet by mouth daily.     scopolamine (TRANSDERM SCOP, 1.5 MG,) 1 MG/3DAYS Place 1 patch (1.5 mg total) onto the skin every 3 (three) days. 4 patch 0   TURMERIC CURCUMIN PO Take 1 capsule by mouth daily.     UNABLE TO FIND Med Name: Vitamin B powder added to shakes     valsartan (DIOVAN) 160 MG tablet Take 1 tablet (160 mg total) by mouth daily. 30 tablet 11   amLODipine (NORVASC) 5 MG tablet Take 1 tablet (5 mg total) by mouth daily. 90 tablet 2   No current facility-administered medications for this visit.    REVIEW OF SYSTEMS:   Constitutional: ( - ) fevers, ( - )  chills , ( - ) night sweats Eyes: ( - ) blurriness of vision, ( - ) double vision, ( - ) watery eyes Ears, nose, mouth, throat, and face: ( - ) mucositis, ( - ) sore throat Respiratory: ( - ) cough, ( - ) dyspnea, ( - ) wheezes Cardiovascular: ( - ) palpitation, ( - ) chest discomfort, ( - ) lower extremity swelling Gastrointestinal:  ( - ) nausea, ( - ) heartburn, ( - ) change in bowel habits Skin: ( - ) abnormal skin rashes Lymphatics: ( - ) new lymphadenopathy, ( - ) easy bruising Neurological: ( - ) numbness, ( - ) tingling, ( - ) new weaknesses Behavioral/Psych: ( - ) mood change, ( - ) new changes  All other systems were reviewed with the patient and are negative.  PHYSICAL EXAMINATION: ECOG PERFORMANCE STATUS: 1 - Symptomatic but completely ambulatory  Vitals:   05/23/23 1159  BP: (!) 145/90  Pulse: (!) 56  Resp: 17  Temp: 98.1 F (36.7 C)  SpO2: 100%   Filed Weights   05/23/23  1159  Weight: 195 lb 9.6 oz (88.7 kg)    GENERAL: well appearing female in NAD  SKIN: skin color, texture, turgor are normal, no rashes or significant lesions EYES: conjunctiva are pink and non-injected, sclera clear LUNGS: clear to auscultation and percussion with normal breathing effort HEART: regular rate & rhythm and no murmurs and no lower extremity edema Musculoskeletal: no cyanosis of digits and no clubbing  PSYCH: alert & oriented x 3, fluent speech NEURO: no focal  motor/sensory deficits  LABORATORY DATA:  I have reviewed the data as listed    Latest Ref Rng & Units 05/23/2023   11:11 AM 04/16/2023    4:03 PM 03/06/2022    9:10 AM  CBC  WBC 4.0 - 10.5 K/uL 3.9  4.9  4.0   Hemoglobin 12.0 - 15.0 g/dL 29.5  62.1  30.8   Hematocrit 36.0 - 46.0 % 37.7  33.6  37.4   Platelets 150 - 400 K/uL 194  181  167        Latest Ref Rng & Units 02/19/2023   11:40 AM 10/02/2022   11:56 AM 03/06/2022    9:10 AM  CMP  Glucose 70 - 99 mg/dL 50  73  90   BUN 8 - 27 mg/dL 18  13  18    Creatinine 0.57 - 1.00 mg/dL 6.57  8.46  9.62   Sodium 134 - 144 mmol/L 143  143  139   Potassium 3.5 - 5.2 mmol/L 4.0  3.8  3.5   Chloride 96 - 106 mmol/L 103  103  103   CO2 20 - 29 mmol/L 26  26  32   Calcium 8.7 - 10.3 mg/dL 9.0  9.2  9.5   Total Protein 6.0 - 8.5 g/dL 7.4  7.3  7.9   Total Bilirubin 0.0 - 1.2 mg/dL 0.4  0.5  0.5   Alkaline Phos 44 - 121 IU/L 59  59  59   AST 0 - 40 IU/L 14  12  16    ALT 0 - 32 IU/L 5  5  5     ASSESSMENT & PLAN [name] [name] is a 62 y.o. female who presents for a follow up for IgG Kappa MGUS.   #IgG kappa MGUS: --Labs today show WBC 3.9, Hgb 12.0, Plt 194K. CMP unremarkable with normal creatinine and calcium levels. SPEP and serum free light chains pending.  --Most recent SPEP from 02/18/2022 showed M protein measuring 0.8 g/dL.  --Need 24 hour UPEP, patient given collection container today --Due for DG bone met survey to evaluate for lytic lesions, plan to schedule  in the next 1-2 weeks --RTC in 12 months with MGUS clinic unless above workup requires further intervention.    No orders of the defined types were placed in this encounter.   All questions were answered. The patient knows to call the clinic with any problems, questions or concerns.  I have spent a total of 25 minutes minutes of face-to-face and non-face-to-face time, preparing to see the patient,  performing a medically appropriate examination, counseling and educating the patient, ordering tests/procedures, documenting clinical information in the electronic health record, independently interpreting results and communicating results to the patient, and care coordination.   Georga Kaufmann, PA-C Department of Hematology/Oncology [facility] Phone: [phone]

## 2023-05-26 DIAGNOSIS — M899 Disorder of bone, unspecified: Secondary | ICD-10-CM | POA: Diagnosis not present

## 2023-05-26 DIAGNOSIS — D472 Monoclonal gammopathy: Secondary | ICD-10-CM | POA: Diagnosis not present

## 2023-05-26 DIAGNOSIS — I7300 Raynaud's syndrome without gangrene: Secondary | ICD-10-CM | POA: Diagnosis not present

## 2023-05-26 LAB — KAPPA/LAMBDA LIGHT CHAINS
Kappa free light chain: 15.2 mg/L (ref 3.3–19.4)
Kappa, lambda light chain ratio: 1.32 (ref 0.26–1.65)
Lambda free light chains: 11.5 mg/L (ref 5.7–26.3)

## 2023-05-27 LAB — UPEP/UIFE/LIGHT CHAINS/TP, 24-HR UR
% BETA, Urine: 24.8 %
ALPHA 1 URINE: 6.2 %
Albumin, U: 37.5 %
Alpha 2, Urine: 11.4 %
Free Kappa Lt Chains,Ur: 40.2 mg/L (ref 1.17–86.46)
Free Kappa/Lambda Ratio: 4.38 (ref 1.83–14.26)
Free Lambda Lt Chains,Ur: 9.17 mg/L (ref 0.27–15.21)
GAMMA GLOBULIN URINE: 20.1 %
M-SPIKE %, Urine: 7.2 % — ABNORMAL HIGH
M-Spike, Mg/24 Hr: 9 mg/(24.h) — ABNORMAL HIGH
Total Protein, Urine-Ur/day: 128 mg/(24.h) (ref 30–150)
Total Protein, Urine: 13.5 mg/dL
Total Volume: 950

## 2023-05-27 LAB — MULTIPLE MYELOMA PANEL, SERUM
Albumin SerPl Elph-Mcnc: 4.1 g/dL (ref 2.9–4.4)
Albumin/Glob SerPl: 1.3 (ref 0.7–1.7)
Alpha 1: 0.2 g/dL (ref 0.0–0.4)
Alpha2 Glob SerPl Elph-Mcnc: 0.7 g/dL (ref 0.4–1.0)
B-Globulin SerPl Elph-Mcnc: 0.8 g/dL (ref 0.7–1.3)
Gamma Glob SerPl Elph-Mcnc: 1.6 g/dL (ref 0.4–1.8)
Globulin, Total: 3.4 g/dL (ref 2.2–3.9)
IgA: 101 mg/dL (ref 87–352)
IgG (Immunoglobin G), Serum: 1729 mg/dL — ABNORMAL HIGH (ref 586–1602)
IgM (Immunoglobulin M), Srm: 92 mg/dL (ref 26–217)
M Protein SerPl Elph-Mcnc: 0.9 g/dL — ABNORMAL HIGH
Total Protein ELP: 7.5 g/dL (ref 6.0–8.5)

## 2023-05-28 ENCOUNTER — Ambulatory Visit (HOSPITAL_COMMUNITY)
Admission: RE | Admit: 2023-05-28 | Discharge: 2023-05-28 | Disposition: A | Discharge status: Home / Self Care | Payer: BC Managed Care – PPO | Source: Ambulatory Visit | Attending: Physician Assistant | Admitting: Physician Assistant

## 2023-05-28 DIAGNOSIS — D472 Monoclonal gammopathy: Secondary | ICD-10-CM | POA: Diagnosis not present

## 2023-05-28 DIAGNOSIS — M47816 Spondylosis without myelopathy or radiculopathy, lumbar region: Secondary | ICD-10-CM | POA: Diagnosis not present

## 2023-05-28 DIAGNOSIS — M47812 Spondylosis without myelopathy or radiculopathy, cervical region: Secondary | ICD-10-CM | POA: Diagnosis not present

## 2023-06-03 ENCOUNTER — Telehealth: Payer: Self-pay | Admitting: Physician Assistant

## 2023-06-03 NOTE — Telephone Encounter (Signed)
I called [name] [name] to review the lab/urine/imaging results after our visit on 05/23/2023. Findings are consistent with MGUS and no evidence of end organ damage. Recommend to continue with surveillance and return in 1 year with repeat labs.   [name] [name] expressed understanding of the plan provided.

## 2023-06-27 ENCOUNTER — Other Ambulatory Visit: Payer: Self-pay | Admitting: Internal Medicine

## 2023-06-27 DIAGNOSIS — I10 Essential (primary) hypertension: Secondary | ICD-10-CM

## 2023-07-22 ENCOUNTER — Other Ambulatory Visit: Payer: Self-pay | Admitting: Internal Medicine

## 2023-07-22 DIAGNOSIS — Z1231 Encounter for screening mammogram for malignant neoplasm of breast: Secondary | ICD-10-CM

## 2023-08-06 ENCOUNTER — Encounter: Payer: Self-pay | Admitting: Diagnostic Neuroimaging

## 2023-08-06 ENCOUNTER — Ambulatory Visit: Payer: BC Managed Care – PPO | Admitting: Diagnostic Neuroimaging

## 2023-08-06 VITALS — BP 142/86 | HR 90 | Ht 67.0 in | Wt 200.6 lb

## 2023-08-06 DIAGNOSIS — G5712 Meralgia paresthetica, left lower limb: Secondary | ICD-10-CM | POA: Diagnosis not present

## 2023-08-06 DIAGNOSIS — R413 Other amnesia: Secondary | ICD-10-CM | POA: Diagnosis not present

## 2023-08-06 NOTE — Progress Notes (Addendum)
[facility]  PATIENT: [name] [name] DOB: [birth year]  REFERRING CLINICIAN: Dorothyann Peng, MD HISTORY FROM: patient  REASON FOR VISIT: new consult   HISTORICAL  CHIEF COMPLAINT:  Chief Complaint  Patient presents with   New Patient (Initial Visit)    Patient in room #6 and alone. Patient states she having numbness and tingling in her left leg and thigh area. Patient states she has numbness and locking in both finger tips and hands. Patient states she has tension pressure in back of her neck.    HISTORY OF PRESENT ILLNESS:   62 year old female here for evaluation of left thigh numbness.  Patient has had intermittent left anterior thigh lateral thigh numbness sensation for last 1 to 2 years, especially when she stands up for long time.  She saw orthopedic clinic earlier this year for chronic low back pain issues.  She went to physical therapy for 4 weeks with mild benefit.  Also notes some mild intermittent recall difficulty the last 1 to 2 years.  Has been under more stress lately.  Has had some interrupted sleep.    REVIEW OF SYSTEMS: Full 14 system review of systems performed and negative with exception of: as per HPI.  ALLERGIES: No Known Allergies  HOME MEDICATIONS: Outpatient Medications Prior to Visit  Medication Sig Dispense Refill   amLODipine (NORVASC) 10 MG tablet Take 1 tablet (10 mg total) by mouth daily. 30 tablet 11   Biotin 16109 MCG TABS Take by mouth.     Calcium Citrate-Vitamin D (CELEBRATE CALCIUM PLUS 500 PO) Take by mouth. 3 chewables daily     carvedilol (COREG) 12.5 MG tablet Take 1 tablet by mouth twice daily 180 tablet 0   hydrocortisone (ANUSOL-HC) 2.5 % rectal cream Place 1 Application rectally 2 (two) times daily. 30 g 0   Multiple Vitamins-Minerals (CELEBRATE MULTI-COMPLETE 18) CHEW Chew by mouth. 1 per day     polyethylene glycol (MIRALAX) 17 g packet Take 17 g by mouth daily. 14 each 0   psyllium (METAMUCIL) 58.6 % packet  Take 1 packet by mouth daily.     TURMERIC CURCUMIN PO Take 1 capsule by mouth daily.     UNABLE TO FIND Med Name: Vitamin B powder added to shakes     valsartan (DIOVAN) 160 MG tablet Take 1 tablet (160 mg total) by mouth daily. 30 tablet 11   scopolamine (TRANSDERM SCOP, 1.5 MG,) 1 MG/3DAYS Place 1 patch (1.5 mg total) onto the skin every 3 (three) days. (Patient not taking: Reported on 08/06/2023) 4 patch 0   No facility-administered medications prior to visit.    PAST MEDICAL HISTORY: Past Medical History:  Diagnosis Date   Abnormal glucose    Candidiasis    Cervicalgia    Essential hypertension 12/07/2016   Flatulence    Hypertension    Localized swelling, mass and lump, head    Lumbago    Morbid obesity (HCC) 12/07/2016   Obesity 12/07/2016   Onychomycosis     PAST SURGICAL HISTORY: Past Surgical History:  Procedure Laterality Date   ABDOMINAL HYSTERECTOMY     LAPAROSCOPIC GASTRIC SLEEVE RESECTION  08/08/2020    FAMILY HISTORY: Family History  Problem Relation Age of Onset   Hypertension Mother    Heart failure Father    Diabetes Father    Heart attack Father    Breast cancer Neg Hx     SOCIAL HISTORY: Social History   Socioeconomic History   Marital status: Married    Spouse  name: Not on file   Number of children: Not on file   Years of education: Not on file   Highest education level: Associate degree: academic program  Occupational History   Not on file  Tobacco Use   Smoking status: Never   Smokeless tobacco: Never  Substance and Sexual Activity   Alcohol use: Not on file   Drug use: Not on file   Sexual activity: Not on file  Other Topics Concern   Not on file  Social History Narrative   Not on file   Social Drivers of Health   Financial Resource Strain: Low Risk  (04/16/2023)   Overall Financial Resource Strain (CARDIA)    Difficulty of Paying Living Expenses: Not very hard  Food Insecurity: No Food Insecurity (04/16/2023)   Hunger Vital  Sign    Worried About Running Out of Food in the Last Year: Never true    Ran Out of Food in the Last Year: Never true  Transportation Needs: No Transportation Needs (04/16/2023)   PRAPARE - Administrator, Civil Service (Medical): No    Lack of Transportation (Non-Medical): No  Physical Activity: Insufficiently Active (04/16/2023)   Exercise Vital Sign    Days of Exercise per Week: 3 days    Minutes of Exercise per Session: 30 min  Stress: No Stress Concern Present (04/16/2023)   [employer] of Occupational Health - Occupational Stress Questionnaire    Feeling of Stress : Only a little  Social Connections: Socially Integrated (04/16/2023)   Social Connection and Isolation Panel [NHANES]    Frequency of Communication with Friends and Family: More than three times a week    Frequency of Social Gatherings with Friends and Family: Once a week    Attends Religious Services: More than 4 times per year    Active Member of [employer] or Organizations: Yes    Attends Engineer, structural: More than 4 times per year    Marital Status: Married  Catering manager Violence: Not on file     PHYSICAL EXAM  GENERAL EXAM/CONSTITUTIONAL: Vitals:  Vitals:   08/06/23 1136  BP: (!) 148/84  Pulse: 90  Weight: 200 lb 9.6 oz (91 kg)  Height: 5\' 7"  (1.702 m)   Body mass index is 31.42 kg/m. Wt Readings from Last 3 Encounters:  08/06/23 200 lb 9.6 oz (91 kg)  05/23/23 195 lb 9.6 oz (88.7 kg)  05/09/23 192 lb (87.1 kg)   Patient is in no distress; well developed, nourished and groomed; neck is supple  CARDIOVASCULAR: Examination of carotid arteries is normal; no carotid bruits Regular rate and rhythm, no murmurs Examination of peripheral vascular system by observation and palpation is normal  EYES: Ophthalmoscopic exam of optic discs and posterior segments is normal; no papilledema or hemorrhages No results found.  MUSCULOSKELETAL: Gait, strength, tone, movements noted  in Neurologic exam below  NEUROLOGIC: MENTAL STATUS:     08/06/2023   12:36 PM  MMSE - Mini Mental State Exam  Orientation to time 5  Orientation to Place 5  Registration 3  Attention/ Calculation 5  Recall 1  Language- name 2 objects 2  Language- repeat 1  Language- follow 3 step command 3  Language- read & follow direction 1  Write a sentence 1  Copy design 1  Total score 28      08/06/2023   12:20 PM  Montreal Cognitive Assessment   Visuospatial/ Executive (0/5) 5  Naming (0/3) 3  Attention: Read list  of digits (0/2) 1  Attention: Read list of letters (0/1) 1  Attention: Serial 7 subtraction starting at 100 (0/3) 1  Language: Repeat phrase (0/2) 2  Language : Fluency (0/1) 1  Abstraction (0/2) 2  Delayed Recall (0/5) 1  Orientation (0/6) 6  Total 23   awake, alert, oriented to person, place and time recent and remote memory intact normal attention and concentration language fluent, comprehension intact, naming intact fund of knowledge appropriate  CRANIAL NERVE:  2nd - no papilledema on fundoscopic exam 2nd, 3rd, 4th, 6th - pupils equal and reactive to light, visual fields full to confrontation, extraocular muscles intact, no nystagmus 5th - facial sensation symmetric 7th - facial strength symmetric 8th - hearing intact 9th - palate elevates symmetrically, uvula midline 11th - shoulder shrug symmetric 12th - tongue protrusion midline  MOTOR:  normal bulk and tone, full strength in the BUE, BLE  SENSORY:  normal and symmetric to light touch, temperature, vibration  COORDINATION:  finger-nose-finger, fine finger movements normal  REFLEXES:  deep tendon reflexes 1+ and symmetric  GAIT/STATION:  narrow based gait     DIAGNOSTIC DATA (LABS, IMAGING, TESTING) - I reviewed patient records, labs, notes, testing and imaging myself where available.  Lab Results  Component Value Date   WBC 3.9 (L) 05/23/2023   HGB 12.0 05/23/2023   HCT 37.7  05/23/2023   MCV 87.9 05/23/2023   PLT 194 05/23/2023      Component Value Date/Time   NA 141 05/23/2023 1111   NA 143 02/19/2023 1140   NA 139 08/22/2017 1254   K 3.5 05/23/2023 1111   K 3.9 08/22/2017 1254   CL 104 05/23/2023 1111   CO2 31 05/23/2023 1111   CO2 31 (H) 08/22/2017 1254   GLUCOSE 78 05/23/2023 1111   GLUCOSE 83 08/22/2017 1254   BUN 15 05/23/2023 1111   BUN 18 02/19/2023 1140   BUN 15.3 08/22/2017 1254   CREATININE 0.78 05/23/2023 1111   CREATININE 0.9 08/22/2017 1254   CALCIUM 9.4 05/23/2023 1111   CALCIUM 9.3 08/22/2017 1254   PROT 7.8 05/23/2023 1111   PROT 7.4 02/19/2023 1140   PROT 8.2 08/22/2017 1254   ALBUMIN 4.4 05/23/2023 1111   ALBUMIN 4.2 02/19/2023 1140   ALBUMIN 4.0 08/22/2017 1254   AST 18 05/23/2023 1111   AST 15 08/22/2017 1254   ALT 6 05/23/2023 1111   ALT 7 08/22/2017 1254   ALKPHOS 51 05/23/2023 1111   ALKPHOS 52 08/22/2017 1254   BILITOT 0.6 05/23/2023 1111   BILITOT 0.51 08/22/2017 1254   GFRNONAA >60 05/23/2023 1111   GFRAA 98 10/05/2020 1447   GFRAA >60 01/19/2020 1450   Lab Results  Component Value Date   CHOL 171 04/16/2023   HDL 64 04/16/2023   LDLCALC 97 04/16/2023   TRIG 48 04/16/2023   CHOLHDL 2.7 04/16/2023   Lab Results  Component Value Date   HGBA1C 5.4 04/16/2023   Lab Results  Component Value Date   VITAMINB12 574 10/02/2022   Lab Results  Component Value Date   TSH 1.050 10/02/2022    10/30/22 CT head [I reviewed images myself and agree with interpretation. -VRP]  - No acute intracranial abnormality.    ASSESSMENT AND PLAN  62 y.o. year old female here with:  Dx:  1. Meralgia paresthetica, left   2. Memory loss     PLAN:  Mild intermittent left thigh numbness - meralgia paresthetica; recommend to restart exercise program (hip opening; core; strength  and flexibility)  Memory loss (mild symptoms; MMSE 28/30; MoCA 23/30; no changes in ADLs; stress and sleep issues could contribute; could be  mild cognitive impairment) - try to stay active physically and get some exercise (at least 15-30 minutes per day) - eat a nutritious diet with lean protein, plants / vegetables, whole grains; avoid ultra-processed foods - increase social activities, brain stimulation, games, puzzles, hobbies, crafts, arts, music; try new activities; keep it fun! - aim for at least 7-8 hours sleep per night (or more) - avoid smoking and alcohol  Return for pending if symptoms worsen or fail to improve, return to PCP.  I spent 60 minutes of face-to-face and non-face-to-face time with patient.  This included previsit chart review, lab review, study review, order entry, electronic health record documentation, patient education.     Suanne Marker, MD 08/06/2023, 12:14 PM Certified in Neurology, Neurophysiology and Neuroimaging  [facility] [address] [place], [place] [address] [phone]

## 2023-08-06 NOTE — Patient Instructions (Signed)
  Mild intermittent left thigh numbness - meralgia paresthetica; recommend to restart exercise program (hip opening; core; strength and flexibility)  Memory loss (mild; no changes in ADLs; stress and sleep issues could contribute) - try to stay active physically and get some exercise (at least 15-30 minutes per day) - eat a nutritious diet with lean protein, plants / vegetables, whole grains; avoid ultra-processed foods - increase social activities, brain stimulation, games, puzzles, hobbies, crafts, arts, music; try new activities; keep it fun! - aim for at least 7-8 hours sleep per night (or more) - avoid smoking and alcohol

## 2023-08-06 NOTE — Addendum Note (Signed)
Addended by: Joycelyn Schmid R on: 08/06/2023 01:53 PM   Modules accepted: Level of Service

## 2023-08-07 ENCOUNTER — Ambulatory Visit: Payer: BC Managed Care – PPO

## 2023-08-27 ENCOUNTER — Ambulatory Visit: Payer: BC Managed Care – PPO | Admitting: Internal Medicine

## 2023-08-27 ENCOUNTER — Encounter: Payer: Self-pay | Admitting: Internal Medicine

## 2023-08-27 VITALS — BP 110/74 | HR 57 | Temp 98.7°F | Ht 67.0 in | Wt 203.0 lb

## 2023-08-27 DIAGNOSIS — Z6831 Body mass index (BMI) 31.0-31.9, adult: Secondary | ICD-10-CM

## 2023-08-27 DIAGNOSIS — R202 Paresthesia of skin: Secondary | ICD-10-CM | POA: Diagnosis not present

## 2023-08-27 DIAGNOSIS — M79609 Pain in unspecified limb: Secondary | ICD-10-CM

## 2023-08-27 DIAGNOSIS — I10 Essential (primary) hypertension: Secondary | ICD-10-CM

## 2023-08-27 DIAGNOSIS — E66811 Obesity, class 1: Secondary | ICD-10-CM

## 2023-08-27 DIAGNOSIS — E6609 Other obesity due to excess calories: Secondary | ICD-10-CM

## 2023-08-27 NOTE — Progress Notes (Signed)
 I,[name], CMA,acting as a neurosurgeon for [name] LOISE Slocumb, MD.,have documented all relevant documentation on the behalf of [name] LOISE Slocumb, MD,as directed by  [name] LOISE Slocumb, MD while in the presence of [name] LOISE Slocumb, MD.  Subjective:  Patient ID: [name] [name] , female    DOB: [birth year] , 63 y.o.   MRN: [id number]  Chief Complaint  Patient presents with   Hypertension    HPI  Patient presents today to discuss blood pressure meds. She reports compliance with meds. She denies having any chest pain, SOB, headaches and dizziness.   Hypertension This is a chronic problem. The current episode started more than 1 year ago. The problem has been gradually improving since onset. The problem is controlled. Pertinent negatives include no blurred vision, chest pain, palpitations or shortness of breath. Risk factors for coronary artery disease include obesity, post-menopausal state and sedentary lifestyle. The current treatment provides moderate improvement. Compliance problems include exercise.      Past Medical History:  Diagnosis Date   Abnormal glucose    Candidiasis    Cervicalgia    Essential hypertension 12/07/2016   Flatulence    Hypertension    Localized swelling, mass and lump, head    Lumbago    Morbid obesity (HCC) 12/07/2016   Obesity 12/07/2016   Onychomycosis      Family History  Problem Relation Age of Onset   Hypertension Mother    Heart failure Father    Diabetes Father    Heart attack Father    Breast cancer Neg Hx      Current Outpatient Medications:    amLODipine  (NORVASC ) 10 MG tablet, Take 1 tablet (10 mg total) by mouth daily., Disp: 30 tablet, Rfl: 11   Biotin 10000 MCG TABS, Take by mouth., Disp: , Rfl:    Calcium Citrate-Vitamin D  (CELEBRATE CALCIUM PLUS 500 PO), Take by mouth. 3 chewables daily, Disp: , Rfl:    carvedilol  (COREG ) 12.5 MG tablet, Take 1 tablet by mouth twice daily, Disp: 180 tablet, Rfl: 0   Multiple Vitamins-Minerals (CELEBRATE  MULTI-COMPLETE 18) CHEW, Chew by mouth. 1 per day, Disp: , Rfl:    polyethylene glycol (MIRALAX ) 17 g packet, Take 17 g by mouth daily., Disp: 14 each, Rfl: 0   psyllium (METAMUCIL) 58.6 % packet, Take 1 packet by mouth daily., Disp: , Rfl:    TURMERIC CURCUMIN PO, Take 1 capsule by mouth daily., Disp: , Rfl:    UNABLE TO FIND, Med Name: Beet powder added to shakes, Disp: , Rfl:    valsartan  (DIOVAN ) 160 MG tablet, Take 1 tablet (160 mg total) by mouth daily., Disp: 30 tablet, Rfl: 11   hydrocortisone  (ANUSOL -HC) 2.5 % rectal cream, Place 1 Application rectally 2 (two) times daily. (Patient not taking: Reported on 08/27/2023), Disp: 30 g, Rfl: 0   scopolamine  (TRANSDERM SCOP , 1.5 MG,) 1 MG/3DAYS, Place 1 patch (1.5 mg total) onto the skin every 3 (three) days. (Patient not taking: Reported on 08/06/2023), Disp: 4 patch, Rfl: 0   No Known Allergies   Review of Systems  Constitutional: Negative.   Eyes:  Negative for blurred vision.  Respiratory: Negative.  Negative for shortness of breath.   Cardiovascular: Negative.  Negative for chest pain and palpitations.  Neurological: Negative.   Psychiatric/Behavioral: Negative.       Today's Vitals   08/27/23 1500  BP: 110/74  Pulse: (!) 57  Temp: 98.7 F (37.1 C)  SpO2: 98%  Weight: 203 lb (92.1 kg)  Height: 5' 7 (1.702 m)   Body mass index is 31.79 kg/m.  Wt Readings from Last 3 Encounters:  08/27/23 203 lb (92.1 kg)  08/06/23 200 lb 9.6 oz (91 kg)  05/23/23 195 lb 9.6 oz (88.7 kg)    BP Readings from Last 3 Encounters:  08/27/23 110/74  08/06/23 (!) 142/86  05/23/23 (!) 145/90     Objective:  Physical Exam Vitals and nursing note reviewed.  Constitutional:      Appearance: Normal appearance.  HENT:     Head: Normocephalic and atraumatic.  Eyes:     Extraocular Movements: Extraocular movements intact.  Cardiovascular:     Rate and Rhythm: Normal rate and regular rhythm.     Heart sounds: Normal heart sounds.  Pulmonary:      Effort: Pulmonary effort is normal.     Breath sounds: Normal breath sounds.  Musculoskeletal:     Comments: Neg Phalen's, Pos Tinel's on R  Skin:    General: Skin is warm.  Neurological:     General: No focal deficit present.     Mental Status: She is alert.  Psychiatric:        Mood and Affect: Mood normal.        Behavior: Behavior normal.         Assessment And Plan:  Essential hypertension Assessment & Plan: Chronic, controlled.  Much improved with amlodipine  10mg  daily and carvedilol  12.5mg  twice daily and valsartan  160mg  daily. She is reminded to follow low sodium diet.    Paresthesia and pain of extremity Assessment & Plan: She has been evaluated by Neurology; unfortunately, provider did NOT order UE nerve conduction study (this was purpose of visit). I will refer her to Dr. Oneita for NCS and make further recommendations once results are available.   Orders: -     Ambulatory referral to Neurology  Class 1 obesity due to excess calories with serious comorbidity and body mass index (BMI) of 31.0 to 31.9 in adult Assessment & Plan: She is s/p gastric sleeve in 2021.  She is encouraged to aim for at least 150 minutes of exercise per week.    She is encouraged to strive for BMI less than 30 to decrease cardiac risk. Advised to aim for at least 150 minutes of exercise per week.    Return if symptoms worsen or fail to improve.  Patient was given opportunity to ask questions. Patient verbalized understanding of the plan and was able to repeat key elements of the plan. All questions were answered to their satisfaction.    I, [name] LOISE Slocumb, MD, have reviewed all documentation for this visit. The documentation on 08/27/23 for the exam, diagnosis, procedures, and orders are all accurate and complete.   IF YOU HAVE BEEN REFERRED TO A SPECIALIST, IT MAY TAKE 1-2 WEEKS TO SCHEDULE/PROCESS THE REFERRAL. IF YOU HAVE NOT HEARD FROM US /SPECIALIST IN TWO WEEKS, PLEASE GIVE US   A CALL AT [phone].   THE PATIENT IS ENCOURAGED TO PRACTICE SOCIAL DISTANCING DUE TO THE COVID-19 PANDEMIC.

## 2023-08-27 NOTE — Patient Instructions (Addendum)
 Face yoga  Hypertension, Adult Hypertension is another name for high blood pressure. High blood pressure forces your heart to work harder to pump blood. This can cause problems over time. There are two numbers in a blood pressure reading. There is a top number (systolic) over a bottom number (diastolic). It is best to have a blood pressure that is below 120/80. What are the causes? The cause of this condition is not known. Some other conditions can lead to high blood pressure. What increases the risk? Some lifestyle factors can make you more likely to develop high blood pressure: Smoking. Not getting enough exercise or physical activity. Being overweight. Having too much fat, sugar, calories, or salt (sodium) in your diet. Drinking too much alcohol. Other risk factors include: Having any of these conditions: Heart disease. Diabetes. High cholesterol. Kidney disease. Obstructive sleep apnea. Having a family history of high blood pressure and high cholesterol. Age. The risk increases with age. Stress. What are the signs or symptoms? High blood pressure may not cause symptoms. Very high blood pressure (hypertensive crisis) may cause: Headache. Fast or uneven heartbeats (palpitations). Shortness of breath. Nosebleed. Vomiting or feeling like you may vomit (nauseous). Changes in how you see. Very bad chest pain. Feeling dizzy. Seizures. How is this treated? This condition is treated by making healthy lifestyle changes, such as: Eating healthy foods. Exercising more. Drinking less alcohol. Your doctor may prescribe medicine if lifestyle changes do not help enough and if: Your top number is above 130. Your bottom number is above 80. Your personal target blood pressure may vary. Follow these instructions at home: Eating and drinking  If told, follow the DASH eating plan. To follow this plan: Fill one half of your plate at each meal with fruits and vegetables. Fill one fourth  of your plate at each meal with whole grains. Whole grains include whole-wheat pasta, brown rice, and whole-grain bread. Eat or drink low-fat dairy products, such as skim milk or low-fat yogurt. Fill one fourth of your plate at each meal with low-fat (lean) proteins. Low-fat proteins include fish, chicken without skin, eggs, beans, and tofu. Avoid fatty meat, cured and processed meat, or chicken with skin. Avoid pre-made or processed food. Limit the amount of salt in your diet to less than 1,500 mg each day. Do not drink alcohol if: Your doctor tells you not to drink. You are pregnant, may be pregnant, or are planning to become pregnant. If you drink alcohol: Limit how much you have to: 0-1 drink a day for women. 0-2 drinks a day for men. Know how much alcohol is in your drink. In the [place] one drink equals one 12 oz bottle of beer (355 mL), one 5 oz glass of wine (148 mL), or one 1 oz glass of hard liquor (44 mL). Lifestyle  Work with your doctor to stay at a healthy weight or to lose weight. Ask your doctor what the best weight is for you. Get at least 30 minutes of exercise that causes your heart to beat faster (aerobic exercise) most days of the week. This may include walking, swimming, or biking. Get at least 30 minutes of exercise that strengthens your muscles (resistance exercise) at least 3 days a week. This may include lifting weights or doing Pilates. Do not smoke or use any products that contain nicotine or tobacco. If you need help quitting, ask your doctor. Check your blood pressure at home as told by your doctor. Keep all follow-up visits. Medicines Take over-the-counter  and prescription medicines only as told by your doctor. Follow directions carefully. Do not skip doses of blood pressure medicine. The medicine does not work as well if you skip doses. Skipping doses also puts you at risk for problems. Ask your doctor about side effects or reactions to medicines that you  should watch for. Contact a doctor if: You think you are having a reaction to the medicine you are taking. You have headaches that keep coming back. You feel dizzy. You have swelling in your ankles. You have trouble with your vision. Get help right away if: You get a very bad headache. You start to feel mixed up (confused). You feel weak or numb. You feel faint. You have very bad pain in your: Chest. Belly (abdomen). You vomit more than once. You have trouble breathing. These symptoms may be an emergency. Get help right away. Call 911. Do not wait to see if the symptoms will go away. Do not drive yourself to the hospital. Summary Hypertension is another name for high blood pressure. High blood pressure forces your heart to work harder to pump blood. For most people, a normal blood pressure is less than 120/80. Making healthy choices can help lower blood pressure. If your blood pressure does not get lower with healthy choices, you may need to take medicine. This information is not intended to replace advice given to you by your health care provider. Make sure you discuss any questions you have with your health care provider. Document Revised: 05/24/2021 Document Reviewed: 05/24/2021 Elsevier Patient Education  2024 [employer].

## 2023-08-28 NOTE — Assessment & Plan Note (Signed)
 Chronic, controlled.  Much improved with amlodipine 10mg  daily and carvedilol 12.5mg  twice daily and valsartan 160mg  daily. She is reminded to follow low sodium diet.

## 2023-08-28 NOTE — Assessment & Plan Note (Signed)
 She has been evaluated by Neurology; unfortunately, provider did NOT order UE nerve conduction study (this was purpose of visit). I will refer her to Dr. Neale Burly for NCS and make further recommendations once results are available.

## 2023-08-28 NOTE — Assessment & Plan Note (Signed)
 She is s/p gastric sleeve in 2021.  She is encouraged to aim for at least 150 minutes of exercise per week.

## 2023-08-29 ENCOUNTER — Other Ambulatory Visit: Payer: Self-pay | Admitting: Internal Medicine

## 2023-08-29 DIAGNOSIS — I10 Essential (primary) hypertension: Secondary | ICD-10-CM

## 2023-09-03 ENCOUNTER — Ambulatory Visit
Admission: RE | Admit: 2023-09-03 | Discharge: 2023-09-03 | Disposition: A | Discharge status: Home / Self Care | Payer: BC Managed Care – PPO | Source: Ambulatory Visit | Attending: Internal Medicine | Admitting: Internal Medicine

## 2023-09-03 DIAGNOSIS — Z1231 Encounter for screening mammogram for malignant neoplasm of breast: Secondary | ICD-10-CM

## 2023-10-04 ENCOUNTER — Other Ambulatory Visit: Payer: Self-pay | Admitting: Internal Medicine

## 2023-10-04 DIAGNOSIS — I10 Essential (primary) hypertension: Secondary | ICD-10-CM

## 2023-11-05 ENCOUNTER — Ambulatory Visit: Admitting: Family Medicine

## 2023-11-05 ENCOUNTER — Encounter: Payer: Self-pay | Admitting: Family Medicine

## 2023-11-05 VITALS — BP 140/90 | HR 62 | Temp 98.2°F | Wt 202.0 lb

## 2023-11-05 DIAGNOSIS — E6609 Other obesity due to excess calories: Secondary | ICD-10-CM

## 2023-11-05 DIAGNOSIS — E66811 Obesity, class 1: Secondary | ICD-10-CM | POA: Diagnosis not present

## 2023-11-05 DIAGNOSIS — I10 Essential (primary) hypertension: Secondary | ICD-10-CM

## 2023-11-05 DIAGNOSIS — Z6831 Body mass index (BMI) 31.0-31.9, adult: Secondary | ICD-10-CM | POA: Diagnosis not present

## 2023-11-05 NOTE — Progress Notes (Signed)
 I,Jameka J Llittleton, CMA,acting as a Neurosurgeon for [employer], NP.,have documented all relevant documentation on the behalf of [name], NP,as directed by  [name], NP while in the presence of [name], NP.  Subjective:  Patient ID: [name] [name] , female    DOB: January 26, 1961 , 63 y.o.   MRN: [id number]  Chief Complaint  Patient presents with   Hypertension    HPI  Patient is a 63 year old female with a diagnosis of hypertension, pain and paresthesia of extremity who presents today to discuss elevation of her blood pressure . Patient reports that she checked her blood pressure this morning and it was 170/110, but after calling the office and making the appointment, patient states that she realized that she forgot to take her  BP medication last night  She denies having any chest pain, SOB, headaches and dizziness. Patient advised to get a medication daily kit  Hypertension This is a chronic problem. The current episode started more than 1 year ago. The problem has been gradually improving since onset. The problem is controlled. Pertinent negatives include no blurred vision. Risk factors for coronary artery disease include obesity, post-menopausal state and sedentary lifestyle. The current treatment provides moderate improvement. Compliance problems include exercise.      Past Medical History:  Diagnosis Date   Abnormal glucose    Candidiasis    Cervicalgia    Essential hypertension 12/07/2016   Flatulence    Hypertension    Localized swelling, mass and lump, head    Lumbago    Morbid obesity (HCC) 12/07/2016   Obesity 12/07/2016   Onychomycosis      Family History  Problem Relation Age of Onset   Hypertension Mother    Heart failure Father    Diabetes Father    Heart attack Father    Breast cancer Neg Hx    BRCA 1/2 Neg Hx      Current Outpatient Medications:    amLODipine (NORVASC) 10 MG tablet, Take 1 tablet (10 mg total) by mouth daily., Disp: 30 tablet, Rfl: 11    Biotin 08657 MCG TABS, Take by mouth., Disp: , Rfl:    Calcium Citrate-Vitamin D (CELEBRATE CALCIUM PLUS 500 PO), Take by mouth. 3 chewables daily, Disp: , Rfl:    carvedilol (COREG) 12.5 MG tablet, Take 1 tablet by mouth twice daily, Disp: 180 tablet, Rfl: 0   Multiple Vitamins-Minerals (CELEBRATE MULTI-COMPLETE 18) CHEW, Chew by mouth. 1 per day, Disp: , Rfl:    polyethylene glycol (MIRALAX) 17 g packet, Take 17 g by mouth daily., Disp: 14 each, Rfl: 0   psyllium (METAMUCIL) 58.6 % packet, Take 1 packet by mouth daily., Disp: , Rfl:    TURMERIC CURCUMIN PO, Take 1 capsule by mouth daily., Disp: , Rfl:    UNABLE TO FIND, Med Name: Beet powder added to shakes, Disp: , Rfl:    valsartan (DIOVAN) 160 MG tablet, Take 1 tablet (160 mg total) by mouth daily., Disp: 30 tablet, Rfl: 11   hydrocortisone (ANUSOL-HC) 2.5 % rectal cream, Place 1 Application rectally 2 (two) times daily. (Patient not taking: Reported on 11/05/2023), Disp: 30 g, Rfl: 0   scopolamine (TRANSDERM SCOP, 1.5 MG,) 1 MG/3DAYS, Place 1 patch (1.5 mg total) onto the skin every 3 (three) days. (Patient not taking: Reported on 11/05/2023), Disp: 4 patch, Rfl: 0   No Known Allergies   Review of Systems  Constitutional: Negative.   HENT: Negative.    Eyes: Negative.  Negative for  blurred vision.  Respiratory: Negative.    Cardiovascular: Negative.   Gastrointestinal: Negative.   Musculoskeletal: Negative.   Skin: Negative.   Psychiatric/Behavioral: Negative.       Today's Vitals   11/05/23 1148 11/05/23 1232  BP: (!) 140/80 (!) 140/90  Pulse: 62   Temp: 98.2 F (36.8 C)   TempSrc: Oral   Weight: 202 lb (91.6 kg)   PainSc: 0-No pain    Body mass index is 31.64 kg/m.  Wt Readings from Last 3 Encounters:  11/05/23 202 lb (91.6 kg)  08/27/23 203 lb (92.1 kg)  08/06/23 200 lb 9.6 oz (91 kg)    The 10-year ASCVD risk score (Arnett DK, et al., 2019) is: 7%   Values used to calculate the score:     Age: 26 years     Sex:  Female     Is Non-Hispanic African American: Yes     Diabetic: No     Tobacco smoker: No     Systolic Blood Pressure: 140 mmHg     Is BP treated: Yes     HDL Cholesterol: 64 mg/dL     Total Cholesterol: 171 mg/dL  Objective:  Physical Exam HENT:     Head: Normocephalic.  Cardiovascular:     Rate and Rhythm: Normal rate and regular rhythm.  Pulmonary:     Effort: Pulmonary effort is normal.     Breath sounds: Normal breath sounds.  Abdominal:     General: Bowel sounds are normal.  Neurological:     Mental Status: She is alert.         Assessment And Plan:  Essential hypertension Assessment & Plan: Forgot to take her BP medications; continue current medications and keep a record of BP readings for the next few days.   Class 1 obesity due to excess calories with serious comorbidity and body mass index (BMI) of 31.0 to 31.9 in adult Assessment & Plan: She is encouraged to strive for BMI less than 30 to decrease cardiac risk. Advised to aim for at least 150 minutes of exercise per week.      Return if symptoms worsen or fail to improve, for Keep next appt.  Patient was given opportunity to ask questions. Patient verbalized understanding of the plan and was able to repeat key elements of the plan. All questions were answered to their satisfaction.  I, [name], NP, have reviewed all documentation for this visit. The documentation on 11/14/2023 for the exam, diagnosis, procedures, and orders are all accurate and complete.    IF YOU HAVE BEEN REFERRED TO A SPECIALIST, IT MAY TAKE 1-2 WEEKS TO SCHEDULE/PROCESS THE REFERRAL. IF YOU HAVE NOT HEARD FROM US/SPECIALIST IN TWO WEEKS, PLEASE GIVE [place] A CALL AT [phone].

## 2023-11-05 NOTE — Assessment & Plan Note (Signed)
 Forgot to take her BP medications; continue current medications and keep a record of BP readings for the next few days.

## 2023-11-05 NOTE — Patient Instructions (Signed)
 Hypertension, Adult Hypertension is another name for high blood pressure. High blood pressure forces your heart to work harder to pump blood. This can cause problems over time. There are two numbers in a blood pressure reading. There is a top number (systolic) over a bottom number (diastolic). It is best to have a blood pressure that is below 120/80. What are the causes? The cause of this condition is not known. Some other conditions can lead to high blood pressure. What increases the risk? Some lifestyle factors can make you more likely to develop high blood pressure: Smoking. Not getting enough exercise or physical activity. Being overweight. Having too much fat, sugar, calories, or salt (sodium) in your diet. Drinking too much alcohol. Other risk factors include: Having any of these conditions: Heart disease. Diabetes. High cholesterol. Kidney disease. Obstructive sleep apnea. Having a family history of high blood pressure and high cholesterol. Age. The risk increases with age. Stress. What are the signs or symptoms? High blood pressure may not cause symptoms. Very high blood pressure (hypertensive crisis) may cause: Headache. Fast or uneven heartbeats (palpitations). Shortness of breath. Nosebleed. Vomiting or feeling like you may vomit (nauseous). Changes in how you see. Very bad chest pain. Feeling dizzy. Seizures. How is this treated? This condition is treated by making healthy lifestyle changes, such as: Eating healthy foods. Exercising more. Drinking less alcohol. Your doctor may prescribe medicine if lifestyle changes do not help enough and if: Your top number is above 130. Your bottom number is above 80. Your personal target blood pressure may vary. Follow these instructions at home: Eating and drinking  If told, follow the DASH eating plan. To follow this plan: Fill one half of your plate at each meal with fruits and vegetables. Fill one fourth of your plate  at each meal with whole grains. Whole grains include whole-wheat pasta, brown rice, and whole-grain bread. Eat or drink low-fat dairy products, such as skim milk or low-fat yogurt. Fill one fourth of your plate at each meal with low-fat (lean) proteins. Low-fat proteins include fish, chicken without skin, eggs, beans, and tofu. Avoid fatty meat, cured and processed meat, or chicken with skin. Avoid pre-made or processed food. Limit the amount of salt in your diet to less than 1,500 mg each day. Do not drink alcohol if: Your doctor tells you not to drink. You are pregnant, may be pregnant, or are planning to become pregnant. If you drink alcohol: Limit how much you have to: 0-1 drink a day for women. 0-2 drinks a day for men. Know how much alcohol is in your drink. In the [place] one drink equals one 12 oz bottle of beer (355 mL), one 5 oz glass of wine (148 mL), or one 1 oz glass of hard liquor (44 mL). Lifestyle  Work with your doctor to stay at a healthy weight or to lose weight. Ask your doctor what the best weight is for you. Get at least 30 minutes of exercise that causes your heart to beat faster (aerobic exercise) most days of the week. This may include walking, swimming, or biking. Get at least 30 minutes of exercise that strengthens your muscles (resistance exercise) at least 3 days a week. This may include lifting weights or doing Pilates. Do not smoke or use any products that contain nicotine or tobacco. If you need help quitting, ask your doctor. Check your blood pressure at home as told by your doctor. Keep all follow-up visits. Medicines Take over-the-counter and prescription medicines  only as told by your doctor. Follow directions carefully. Do not skip doses of blood pressure medicine. The medicine does not work as well if you skip doses. Skipping doses also puts you at risk for problems. Ask your doctor about side effects or reactions to medicines that you should watch  for. Contact a doctor if: You think you are having a reaction to the medicine you are taking. You have headaches that keep coming back. You feel dizzy. You have swelling in your ankles. You have trouble with your vision. Get help right away if: You get a very bad headache. You start to feel mixed up (confused). You feel weak or numb. You feel faint. You have very bad pain in your: Chest. Belly (abdomen). You vomit more than once. You have trouble breathing. These symptoms may be an emergency. Get help right away. Call 911. Do not wait to see if the symptoms will go away. Do not drive yourself to the hospital. Summary Hypertension is another name for high blood pressure. High blood pressure forces your heart to work harder to pump blood. For most people, a normal blood pressure is less than 120/80. Making healthy choices can help lower blood pressure. If your blood pressure does not get lower with healthy choices, you may need to take medicine. This information is not intended to replace advice given to you by your health care provider. Make sure you discuss any questions you have with your health care provider. Document Revised: 05/24/2021 Document Reviewed: 05/24/2021 Elsevier Patient Education  2024 [employer].

## 2023-11-14 NOTE — Assessment & Plan Note (Signed)
 She is encouraged to strive for BMI less than 30 to decrease cardiac risk. Advised to aim for at least 150 minutes of exercise per week.

## 2023-11-26 DIAGNOSIS — Z903 Acquired absence of stomach [part of]: Secondary | ICD-10-CM | POA: Diagnosis not present

## 2023-11-26 NOTE — Progress Notes (Signed)
 Chief Complaint  Patient presents with  . Follow-up    Date of Surgery: 08/08/2020 Type of Surgery: Lap Sleeve (36 VG), Crural Plasty Initial Weight: 258 Last Weight: 191 Today's Weight: 197 Weight Loss Since Last Visit: +6 Total Weight Loss to Date: -57  History of Present Illness The patient presents for evaluation of weight management, hypertension, bilateral knee pain, and vitamin supplementation.  She has been maintaining a stable weight since her last visit in January 2024, with a current weight of 200 pounds. Her goal is to achieve a weight of 180 to 185 pounds. She has considered the use of weight loss injections but is uncertain about insurance coverage. She is not diabetic but has been informed of a prediabetic condition. She has previously used phentermine for weight loss. She continues to feel satiated easily post-surgery, particularly when consuming potatoes and certain meats. She avoids pizza and other similar foods, opting to consume only the toppings. She also refrains from drinking before meals to prevent getting too full too quickly. She is mindful of her sugar and starch intake. She has expressed interest in consulting a nutritionist if her weight continues to increase.  She is currently on antihypertensive medication, having reduced from four to two medications post-surgery, but has recently increased to three due to rising blood pressure. Her regimen includes amlodipine , carvedilol , and valsartan . She monitors her blood pressure at home using a cuff provided by [employer].  She reports no heartburn or reflux symptoms. She acknowledges a lack of physical activity, attributing it to bilateral knee pain. She underwent knee surgery for a torn meniscus several years ago and now experiences a catching sensation under her kneecap and on the inside of her right knee. She was previously informed that her cartilage was intact and did not require knee replacement, but she does have  arthritis. She suspects a minor tear due to the similarity of the sensation to her previous injury.   She has been advised to consult a neurologist due to tingling burning in her left anterior thigh.  A recent bone scan showed no abnormalities.  She was previously on a bariatric vitamin regimen but has switched to BariatricPal multivitamin with iron due to cost. She has received B12 injections in the past but has since discontinued them. She does not take vitamin D  supplements.  MEDICATIONS Current: amlodipine , carvedilol , valsartan , BariatricPal multivitamin with iron Past: phentermine   Patient Active Problem List   Diagnosis Date Noted  . History of sleeve gastrectomy 08/08/2021  . Morbid obesity (HCC) 08/08/2020  . Class 3 severe obesity due to excess calories with serious comorbidity and body mass index (BMI) of 40.0 to 44.9 in adult (HCC) 12/07/2016  . Essential hypertension 12/07/2016   Past Medical History:  Diagnosis Date  . Hypertension   . PONV (postoperative nausea and vomiting)     Past Surgical History:  Procedure Laterality Date  . CESAREAN SECTION, UNSPECIFIED     Procedure: CESAREAN SECTION  . COLONOSCOPY     Procedure: COLONOSCOPY  . HYSTERECTOMY      Procedure: HYSTERECTOMY  . KNEE ARTHROSCOPY Right    Procedure: KNEE ARTHROSCOPY; x 2  . SLEEVE GASTROPLASTY N/A 08/08/2020   Procedure: SLEEVE GASTRECTOMY LAPAROSCOPIC;  Surgeon: Wilfred Enzo Guan, MD;  Location[facility] OR;  Service: General;  Laterality: N/A;    No Known Allergies Current Outpatient Medications  Medication Sig Dispense Refill  . amLODIPine  (NORVASC ) 2.5 mg tablet     . biotin 1 mg capsule Take  by mouth Once Daily.    . calcium citrate 250 mg calcium tab Take  by mouth.    . carvediloL  (COREG ) 12.5 mg tablet Take 12.5 mg by mouth 2 (two) times a day with meals.    . collagen-biotin-ascorbic acid (Collagen 1500 Plus C) 500 mg-800 mcg- 50 mg cap Take  by mouth.    . multivitamin  with folic acid 400 mcg tab Take 1 tablet by mouth Once Daily.    . psyllium (Metamucil Fiber Singles) 3.4 gram packet Take 1 packet by mouth Once Daily.    SABRA turmeric 400 mg cap Take  by mouth.     No current facility-administered medications for this visit.   Social History   Socioeconomic History  . Marital status: Married    Spouse name: None  . Number of children: None  . Years of education: None  . Highest education level: None  Occupational History  . None  Tobacco Use  . Smoking status: Never    Passive exposure: Never  . Smokeless tobacco: Never  Substance and Sexual Activity  . Alcohol use: Not Currently  . Drug use: Never  . Sexual activity: None  Other Topics Concern  . None  Social History Narrative  . None   Social Drivers of Health   Food Insecurity: No Food Insecurity (04/16/2023)   Received from [facility]   Food vital sign   . Within the past 12 months, you worried that your food would run out before you got money to buy more: Never true   . Within the past 12 months, the food you bought just didn't last and you didn't have money to get more: Never true  Transportation Needs: No Transportation Needs (04/16/2023)   Received from [facility] - Transportation   . Lack of Transportation (Medical): No   . Lack of Transportation (Non-Medical): No  Safety: Not on file  Living Situation: Not on file    No family history on file.   Objective: Blood pressure 147/87, pulse 56, height 1.702 m (5' 7), weight 89.3 kg (196 lb 13.9 oz), SpO2 98%. GENERAL: in no apparent distress, well developed and well nourished, and non-toxic HEAD:Normocephalic. Atraumatic. PSYCH: oriented to time, place and person, mood and affect are appropriate, pt is a good historian; no memory problems were noted   Data Review:   Lab Results  Component Value Date   WBC 5.0 12/07/2020   HGB 11.1 (L) 12/07/2020   HCT 33.3 (L) 12/07/2020   PLT 164 12/07/2020   ALT 5  12/07/2020   AST 15 12/07/2020   NA 140 12/07/2020   K 3.4 (L) 12/07/2020   CL 101 12/07/2020   CREATININE 0.94 12/07/2020   BUN 21 12/07/2020   CO2 32 (H) 12/07/2020    Reviewed labs from [facility] via portal.  Results Laboratory Studies Hematocrit was 37.7, hemoglobin was 12, platelets were 194. Liver and kidney function tests were normal. A1c was 5.4. Iron levels were good. B12 was 574, vitamin D  was 83. Immunoglobulins were normal.  Imaging Bone scan showed some changes in the spine due to aging, but no other abnormalities.    Assessment/Plan: 1. History of sleeve gastrectomy        Assessment & Plan 1. Weight management. Her weight has increased by approximately 10 pounds since her last visit, with a recorded weight of 186 pounds at Dr. Everlyn office in February 2023. She has been informed that her insurance does not  cover weight loss injections such as Wegovy or Zepbound, but it does cover Mounjaro for diabetes management. The potential use of phentermine was discussed, including its benefits and drawbacks such as increased blood pressure, heart rate, and insomnia. The use of topiramate was also considered. She declined a referral to a nutritionist at this time. She was advised to incorporate healthy fats into her diet, such as avocados, nuts, meats, cheeses, and dairy products, and to avoid processed foods. She was also cautioned about the potential risks associated with eating out.  2. Hypertension. She is currently on three medications: amlodipine , carvedilol , and valsartan . Her blood pressure has been creeping up, necessitating the addition of a third medication. She was advised to continue monitoring her blood pressure at home using the cuff provided by [facility].  3. Bilateral knee pain. She reports experiencing a catching sensation in both knees, which could be indicative of arthritis or a potential tear. She was advised to seek medical attention for her knee  pain, particularly if it is limiting her mobility. She was also encouraged to engage in physical activity and to perform exercises recommended by her physical therapist.  4. Vitamin supplementation. Her B12 level was 574 in February 2024, which is within the normal range. Her vitamin D  level was 83, which is on the higher end of the normal range. She was advised to discontinue any vitamin D  supplements.  Follow-up The patient will follow up in 1 year.  PROCEDURE The patient underwent knee surgery for a torn meniscus several years ago.     Wilfred Winfred Raddle, MD

## 2023-12-30 NOTE — Progress Notes (Unsigned)
 I,Victoria T Basil Lim, CMA,acting as a Neurosurgeon for Smiley Dung, MD.,have documented all relevant documentation on the behalf of Smiley Dung, MD,as directed by  Smiley Dung, MD while in the presence of Smiley Dung, MD.  Subjective:  Patient ID: [name] [name] , female    DOB: 18-Nov-1960 , 63 y.o.   MRN: [id number]  No chief complaint on file.   HPI  HPI   Past Medical History:  Diagnosis Date   Abnormal glucose    Candidiasis    Cervicalgia    Essential hypertension 12/07/2016   Flatulence    Hypertension    Localized swelling, mass and lump, head    Lumbago    Morbid obesity (HCC) 12/07/2016   Obesity 12/07/2016   Onychomycosis      Family History  Problem Relation Age of Onset   Hypertension Mother    Heart failure Father    Diabetes Father    Heart attack Father    Breast cancer Neg Hx    BRCA 1/2 Neg Hx      Current Outpatient Medications:    amLODipine  (NORVASC ) 10 MG tablet, Take 1 tablet (10 mg total) by mouth daily., Disp: 30 tablet, Rfl: 11   Biotin 10000 MCG TABS, Take by mouth., Disp: , Rfl:    Calcium Citrate-Vitamin D  (CELEBRATE CALCIUM PLUS 500 PO), Take by mouth. 3 chewables daily, Disp: , Rfl:    carvedilol  (COREG ) 12.5 MG tablet, Take 1 tablet by mouth twice daily, Disp: 180 tablet, Rfl: 0   hydrocortisone  (ANUSOL -HC) 2.5 % rectal cream, Place 1 Application rectally 2 (two) times daily. (Patient not taking: Reported on 11/05/2023), Disp: 30 g, Rfl: 0   Multiple Vitamins-Minerals (CELEBRATE MULTI-COMPLETE 18) CHEW, Chew by mouth. 1 per day, Disp: , Rfl:    polyethylene glycol (MIRALAX ) 17 g packet, Take 17 g by mouth daily., Disp: 14 each, Rfl: 0   psyllium (METAMUCIL) 58.6 % packet, Take 1 packet by mouth daily., Disp: , Rfl:    scopolamine  (TRANSDERM SCOP , 1.5 MG,) 1 MG/3DAYS, Place 1 patch (1.5 mg total) onto the skin every 3 (three) days. (Patient not taking: Reported on 11/05/2023), Disp: 4 patch, Rfl: 0   TURMERIC CURCUMIN PO, Take 1  capsule by mouth daily., Disp: , Rfl:    UNABLE TO FIND, Med Name: Beet powder added to shakes, Disp: , Rfl:    valsartan  (DIOVAN ) 160 MG tablet, Take 1 tablet (160 mg total) by mouth daily., Disp: 30 tablet, Rfl: 11   No Known Allergies   Review of Systems  Constitutional: Negative.   Respiratory: Negative.    Cardiovascular: Negative.   Neurological: Negative.   Psychiatric/Behavioral: Negative.       There were no vitals filed for this visit. There is no height or weight on file to calculate BMI.  Wt Readings from Last 3 Encounters:  11/05/23 202 lb (91.6 kg)  08/27/23 203 lb (92.1 kg)  08/06/23 200 lb 9.6 oz (91 kg)     Objective:  Physical Exam      Assessment And Plan:  Essential hypertension  Iron deficiency anemia, unspecified iron deficiency anemia type  MGUS (monoclonal gammopathy of unknown significance)  Hemorrhoids, unspecified hemorrhoid type     No follow-ups on file.  Patient was given opportunity to ask questions. Patient verbalized understanding of the plan and was able to repeat key elements of the plan. All questions were answered to their satisfaction.  Smiley Dung, MD  I, Smiley Dung,  MD, have reviewed all documentation for this visit. The documentation on 12/30/23 for the exam, diagnosis, procedures, and orders are all accurate and complete.   IF YOU HAVE BEEN REFERRED TO A SPECIALIST, IT MAY TAKE 1-2 WEEKS TO SCHEDULE/PROCESS THE REFERRAL. IF YOU HAVE NOT HEARD FROM US /SPECIALIST IN TWO WEEKS, PLEASE GIVE US  A CALL AT [phone].   THE PATIENT IS ENCOURAGED TO PRACTICE SOCIAL DISTANCING DUE TO THE COVID-19 PANDEMIC.

## 2023-12-30 NOTE — Patient Instructions (Signed)
 Hypertension, Adult Hypertension is another name for high blood pressure. High blood pressure forces your heart to work harder to pump blood. This can cause problems over time. There are two numbers in a blood pressure reading. There is a top number (systolic) over a bottom number (diastolic). It is best to have a blood pressure that is below 120/80. What are the causes? The cause of this condition is not known. Some other conditions can lead to high blood pressure. What increases the risk? Some lifestyle factors can make you more likely to develop high blood pressure: Smoking. Not getting enough exercise or physical activity. Being overweight. Having too much fat, sugar, calories, or salt (sodium) in your diet. Drinking too much alcohol. Other risk factors include: Having any of these conditions: Heart disease. Diabetes. High cholesterol. Kidney disease. Obstructive sleep apnea. Having a family history of high blood pressure and high cholesterol. Age. The risk increases with age. Stress. What are the signs or symptoms? High blood pressure may not cause symptoms. Very high blood pressure (hypertensive crisis) may cause: Headache. Fast or uneven heartbeats (palpitations). Shortness of breath. Nosebleed. Vomiting or feeling like you may vomit (nauseous). Changes in how you see. Very bad chest pain. Feeling dizzy. Seizures. How is this treated? This condition is treated by making healthy lifestyle changes, such as: Eating healthy foods. Exercising more. Drinking less alcohol. Your doctor may prescribe medicine if lifestyle changes do not help enough and if: Your top number is above 130. Your bottom number is above 80. Your personal target blood pressure may vary. Follow these instructions at home: Eating and drinking  If told, follow the DASH eating plan. To follow this plan: Fill one half of your plate at each meal with fruits and vegetables. Fill one fourth of your plate  at each meal with whole grains. Whole grains include whole-wheat pasta, brown rice, and whole-grain bread. Eat or drink low-fat dairy products, such as skim milk or low-fat yogurt. Fill one fourth of your plate at each meal with low-fat (lean) proteins. Low-fat proteins include fish, chicken without skin, eggs, beans, and tofu. Avoid fatty meat, cured and processed meat, or chicken with skin. Avoid pre-made or processed food. Limit the amount of salt in your diet to less than 1,500 mg each day. Do not drink alcohol if: Your doctor tells you not to drink. You are pregnant, may be pregnant, or are planning to become pregnant. If you drink alcohol: Limit how much you have to: 0-1 drink a day for women. 0-2 drinks a day for men. Know how much alcohol is in your drink. In the [place] one drink equals one 12 oz bottle of beer (355 mL), one 5 oz glass of wine (148 mL), or one 1 oz glass of hard liquor (44 mL). Lifestyle  Work with your doctor to stay at a healthy weight or to lose weight. Ask your doctor what the best weight is for you. Get at least 30 minutes of exercise that causes your heart to beat faster (aerobic exercise) most days of the week. This may include walking, swimming, or biking. Get at least 30 minutes of exercise that strengthens your muscles (resistance exercise) at least 3 days a week. This may include lifting weights or doing Pilates. Do not smoke or use any products that contain nicotine or tobacco. If you need help quitting, ask your doctor. Check your blood pressure at home as told by your doctor. Keep all follow-up visits. Medicines Take over-the-counter and prescription medicines  only as told by your doctor. Follow directions carefully. Do not skip doses of blood pressure medicine. The medicine does not work as well if you skip doses. Skipping doses also puts you at risk for problems. Ask your doctor about side effects or reactions to medicines that you should watch  for. Contact a doctor if: You think you are having a reaction to the medicine you are taking. You have headaches that keep coming back. You feel dizzy. You have swelling in your ankles. You have trouble with your vision. Get help right away if: You get a very bad headache. You start to feel mixed up (confused). You feel weak or numb. You feel faint. You have very bad pain in your: Chest. Belly (abdomen). You vomit more than once. You have trouble breathing. These symptoms may be an emergency. Get help right away. Call 911. Do not wait to see if the symptoms will go away. Do not drive yourself to the hospital. Summary Hypertension is another name for high blood pressure. High blood pressure forces your heart to work harder to pump blood. For most people, a normal blood pressure is less than 120/80. Making healthy choices can help lower blood pressure. If your blood pressure does not get lower with healthy choices, you may need to take medicine. This information is not intended to replace advice given to you by your health care provider. Make sure you discuss any questions you have with your health care provider. Document Revised: 05/24/2021 Document Reviewed: 05/24/2021 Elsevier Patient Education  2024 [employer].

## 2023-12-31 ENCOUNTER — Ambulatory Visit: Payer: BC Managed Care – PPO | Admitting: Internal Medicine

## 2023-12-31 VITALS — BP 116/80 | HR 66 | Temp 98.0°F | Ht 67.0 in | Wt 202.8 lb

## 2023-12-31 DIAGNOSIS — G8929 Other chronic pain: Secondary | ICD-10-CM | POA: Diagnosis not present

## 2023-12-31 DIAGNOSIS — D509 Iron deficiency anemia, unspecified: Secondary | ICD-10-CM

## 2023-12-31 DIAGNOSIS — M25561 Pain in right knee: Secondary | ICD-10-CM

## 2023-12-31 DIAGNOSIS — D472 Monoclonal gammopathy: Secondary | ICD-10-CM

## 2023-12-31 DIAGNOSIS — I10 Essential (primary) hypertension: Secondary | ICD-10-CM

## 2023-12-31 DIAGNOSIS — M25562 Pain in left knee: Secondary | ICD-10-CM

## 2023-12-31 DIAGNOSIS — K649 Unspecified hemorrhoids: Secondary | ICD-10-CM

## 2023-12-31 MED ORDER — VALSARTAN 160 MG PO TABS: 160.0000 mg | ORAL_TABLET | Freq: Every day | ORAL | 2 refills | Status: AC

## 2023-12-31 MED ORDER — AMLODIPINE BESYLATE 10 MG PO TABS: 10.0000 mg | ORAL_TABLET | Freq: Every day | ORAL | 2 refills | Status: AC

## 2023-12-31 MED ORDER — CARVEDILOL 12.5 MG PO TABS: 12.5000 mg | ORAL_TABLET | Freq: Two times a day (BID) | ORAL | 2 refills | Status: AC

## 2023-12-31 NOTE — Assessment & Plan Note (Signed)
 Chronic, controlled. Hypertension managed with valsartan , amlodipine  and carvedilol . Recent elevated blood pressure noted at hematology visit. Transition to 90-day valsartan  supply for cost efficiency. - Prescribe 90-day supply of valsartan  160 mg daily. - Renew prescription for carvedilol  and amlodipine 

## 2023-12-31 NOTE — Assessment & Plan Note (Signed)
 Chronic, stable.  Most recent Oncology note reviewed in detail. She is now being seen every 12 months, last visit October 2024.

## 2023-12-31 NOTE — Assessment & Plan Note (Signed)
 Chronic bilateral knee pain with history of multiple surgeries, potential meniscus issues, affecting exercise ability. - Send referral to orthopedic specialist. - Order updated imaging for right knee.

## 2024-01-05 LAB — CMP14+EGFR
ALT: 6 IU/L (ref 0–32)
AST: 16 IU/L (ref 0–40)
Albumin: 4.3 g/dL (ref 3.8–4.9)
Alkaline Phosphatase: 57 IU/L (ref 44–121)
BUN/Creatinine Ratio: 18 (ref 12–28)
BUN: 15 mg/dL (ref 8–27)
Bilirubin Total: 0.4 mg/dL (ref 0.0–1.2)
CO2: 25 mmol/L (ref 20–29)
Calcium: 9 mg/dL (ref 8.7–10.3)
Chloride: 102 mmol/L (ref 96–106)
Creatinine, Ser: 0.82 mg/dL (ref 0.57–1.00)
Globulin, Total: 2.7 g/dL (ref 1.5–4.5)
Glucose: 80 mg/dL (ref 70–99)
Potassium: 3.9 mmol/L (ref 3.5–5.2)
Sodium: 144 mmol/L (ref 134–144)
Total Protein: 7 g/dL (ref 6.0–8.5)
eGFR: 82 mL/min/{1.73_m2} (ref >59)

## 2024-01-05 LAB — PROTEIN ELECTROPHORESIS, SERUM
A/G Ratio: 1.1 (ref 0.7–1.7)
Albumin ELP: 3.7 g/dL (ref 2.9–4.4)
Alpha 1: 0.2 g/dL (ref 0.0–0.4)
Alpha 2: 0.7 g/dL (ref 0.4–1.0)
Beta: 0.8 g/dL (ref 0.7–1.3)
Gamma Globulin: 1.6 g/dL (ref 0.4–1.8)
Globulin, Total: 3.3 g/dL (ref 2.2–3.9)
M-Spike, %: 0.7 g/dL — ABNORMAL HIGH

## 2024-01-05 LAB — LIPID PANEL
Chol/HDL Ratio: 2.7 ratio (ref 0.0–4.4)
Cholesterol, Total: 170 mg/dL (ref 100–199)
HDL: 64 mg/dL (ref >39)
LDL Chol Calc (NIH): 99 mg/dL (ref 0–99)
Triglycerides: 30 mg/dL (ref 0–149)
VLDL Cholesterol Cal: 7 mg/dL (ref 5–40)

## 2024-01-07 ENCOUNTER — Encounter: Payer: Self-pay | Admitting: Orthopaedic Surgery

## 2024-01-11 ENCOUNTER — Ambulatory Visit: Payer: Self-pay | Admitting: Internal Medicine

## 2024-01-21 ENCOUNTER — Ambulatory Visit: Admitting: Orthopaedic Surgery

## 2024-01-28 ENCOUNTER — Ambulatory Visit: Admitting: Physician Assistant

## 2024-01-30 ENCOUNTER — Ambulatory Visit: Admitting: Orthopaedic Surgery

## 2024-01-30 ENCOUNTER — Other Ambulatory Visit (INDEPENDENT_AMBULATORY_CARE_PROVIDER_SITE_OTHER): Payer: Self-pay

## 2024-01-30 ENCOUNTER — Encounter: Payer: Self-pay | Admitting: Orthopaedic Surgery

## 2024-01-30 DIAGNOSIS — M25561 Pain in right knee: Secondary | ICD-10-CM

## 2024-01-30 DIAGNOSIS — G8929 Other chronic pain: Secondary | ICD-10-CM

## 2024-01-30 DIAGNOSIS — M25562 Pain in left knee: Secondary | ICD-10-CM | POA: Diagnosis not present

## 2024-01-30 NOTE — Progress Notes (Signed)
 Office Visit Note   Patient: [name] [name]           Date of Birth: 1961/03/31           MRN: [id number] Visit Date: 01/30/2024              Requested by: Cleave Curling, MD [address] [place],  [place] [address] PCP: Cleave Curling, MD   Assessment & Plan: Visit Diagnoses:  1. Chronic pain of both knees     Plan: History of Present Illness [name] [name] Mrs. Lusher is a 63 year old female with a history of right knee surgeries who presents with worsening bilateral knee discomfort and instability.  She experiences a 'knee jerking' sensation and a feeling of the knee 'giving way', particularly after prolonged standing or activity. This is accompanied by discomfort, rated as 5 to 6 out of 10, but not pain. The symptoms do not disturb her sleep and improve with rest. Prolonged movement at work exacerbates the discomfort.  She is not taking any medications for her knee symptoms and has not received cortisone injections. She is concerned about the impact on her activity level and is cautious about activities that might worsen her symptoms.  Family history is significant for knee replacements in her sister and brother.  Physical Exam MUSCULOSKELETAL: Right knee MCL ligament slightly lax with solid endpoint. Right knee joint line non-tender. Normal motion.  Left knee normal motion.  No joint line tenderness.  Assessment and Plan Osteoarthritis of bilateral knees Chronic osteoarthritis with significant degeneration and bone-on-bone joint space narrowing. May require TKA eventually. Cortisone injections and knee braces may provide temporary relief. Low-impact exercises recommended. - Consider cortisone injection for symptom relief. - Recommend wearing a compression knee brace. - Advise low-impact exercises such as aquatic activities and stationary biking. - Avoid high-impact activities like running and jumping. - Use extra strength Tylenol for pain management as  needed. - Discuss NSAIDs as an alternative, noting potential side effects with antihypertensive medications.  Osteoarthritis of left knee Mild osteoarthritis with small spurring, not currently a major concern to her right now.  Follow-Up Instructions: No follow-ups on file.   Orders:  Orders Placed This Encounter  Procedures   XR KNEE 3 VIEW LEFT   XR KNEE 3 VIEW RIGHT   No orders of the defined types were placed in this encounter.   Subjective: Chief Complaint  Patient presents with   Right Knee - Pain   Left Knee - Pain    HPI  Review of Systems  Constitutional: Negative.   HENT: Negative.    Eyes: Negative.   Respiratory: Negative.    Cardiovascular: Negative.   Endocrine: Negative.   Musculoskeletal: Negative.   Neurological: Negative.   Hematological: Negative.   Psychiatric/Behavioral: Negative.    All other systems reviewed and are negative.    Objective: Vital Signs: LMP  (LMP Unknown)   Physical Exam Vitals and nursing note reviewed.  Constitutional:      Appearance: She is well-developed.  HENT:     Head: Atraumatic.     Nose: Nose normal.   Eyes:     Extraocular Movements: Extraocular movements intact.    Cardiovascular:     Pulses: Normal pulses.  Pulmonary:     Effort: Pulmonary effort is normal.  Abdominal:     Palpations: Abdomen is soft.   Musculoskeletal:     Cervical back: Neck supple.   Skin:    General: Skin is warm.  Capillary Refill: Capillary refill takes less than 2 seconds.   Neurological:     Mental Status: She is alert. Mental status is at baseline.   Psychiatric:        Behavior: Behavior normal.        Thought Content: Thought content normal.        Judgment: Judgment normal.     Ortho Exam  Specialty Comments:  No specialty comments available.  Imaging: XR KNEE 3 VIEW LEFT Result Date: 01/30/2024 X-rays of the left knee show mild tricompartmental osteoarthritis.  XR KNEE 3 VIEW RIGHT Result  Date: 01/30/2024 X-rays demonstrate severe osteoarthritis.  Bone-on-bone joint space narrowing of the medial compartment    PMFS History: Patient Active Problem List   Diagnosis Date Noted   Chronic pain of both knees 12/31/2023   Blood in stool 05/14/2023   Hemorrhoids 05/14/2023   Other constipation 05/14/2023   Paresthesia and pain of extremity 04/16/2023   MGUS (monoclonal gammopathy of unknown significance) 03/13/2023   Memory loss 03/13/2023   Class 1 obesity due to excess calories with serious comorbidity and body mass index (BMI) of 31.0 to 31.9 in adult 03/13/2023   Edema 06/07/2019   Annual physical exam 05/30/2018   Essential (primary) hypertension 05/30/2018   Essential hypertension 12/07/2016   Past Medical History:  Diagnosis Date   Abnormal glucose    Candidiasis    Cervicalgia    Essential hypertension 12/07/2016   Flatulence    Hypertension    Localized swelling, mass and lump, head    Lumbago    Morbid obesity (HCC) 12/07/2016   Obesity 12/07/2016   Onychomycosis     Family History  Problem Relation Age of Onset   Hypertension Mother    Heart failure Father    Diabetes Father    Heart attack Father    Breast cancer Neg Hx    BRCA 1/2 Neg Hx     Past Surgical History:  Procedure Laterality Date   ABDOMINAL HYSTERECTOMY     LAPAROSCOPIC GASTRIC SLEEVE RESECTION  08/08/2020   Social History   Occupational History   Not on file  Tobacco Use   Smoking status: Never   Smokeless tobacco: Never  Substance and Sexual Activity   Alcohol use: Not on file   Drug use: Not on file   Sexual activity: Not on file

## 2024-03-30 DIAGNOSIS — I10 Essential (primary) hypertension: Secondary | ICD-10-CM | POA: Diagnosis not present

## 2024-03-30 DIAGNOSIS — Z1211 Encounter for screening for malignant neoplasm of colon: Secondary | ICD-10-CM | POA: Diagnosis not present

## 2024-03-30 DIAGNOSIS — K5900 Constipation, unspecified: Secondary | ICD-10-CM | POA: Diagnosis not present

## 2024-03-30 DIAGNOSIS — E669 Obesity, unspecified: Secondary | ICD-10-CM | POA: Diagnosis not present

## 2024-04-27 ENCOUNTER — Encounter: Payer: BC Managed Care – PPO | Admitting: Internal Medicine

## 2024-04-28 ENCOUNTER — Encounter: Payer: BC Managed Care – PPO | Admitting: Internal Medicine

## 2024-04-28 DIAGNOSIS — K5730 Diverticulosis of large intestine without perforation or abscess without bleeding: Secondary | ICD-10-CM | POA: Diagnosis not present

## 2024-04-28 DIAGNOSIS — Z1211 Encounter for screening for malignant neoplasm of colon: Secondary | ICD-10-CM | POA: Diagnosis not present

## 2024-04-28 DIAGNOSIS — K648 Other hemorrhoids: Secondary | ICD-10-CM | POA: Diagnosis not present

## 2024-04-28 LAB — HM COLONOSCOPY

## 2024-05-05 ENCOUNTER — Ambulatory Visit: Payer: Self-pay | Admitting: Internal Medicine

## 2024-05-05 ENCOUNTER — Ambulatory Visit (INDEPENDENT_AMBULATORY_CARE_PROVIDER_SITE_OTHER): Admitting: Internal Medicine

## 2024-05-05 VITALS — BP 130/80 | HR 85 | Temp 98.3°F | Ht 67.0 in | Wt 195.2 lb

## 2024-05-05 DIAGNOSIS — Z0000 Encounter for general adult medical examination without abnormal findings: Secondary | ICD-10-CM

## 2024-05-05 DIAGNOSIS — Z903 Acquired absence of stomach [part of]: Secondary | ICD-10-CM

## 2024-05-05 DIAGNOSIS — D472 Monoclonal gammopathy: Secondary | ICD-10-CM | POA: Diagnosis not present

## 2024-05-05 DIAGNOSIS — K5909 Other constipation: Secondary | ICD-10-CM

## 2024-05-05 DIAGNOSIS — Z79899 Other long term (current) drug therapy: Secondary | ICD-10-CM | POA: Diagnosis not present

## 2024-05-05 DIAGNOSIS — Z6830 Body mass index (BMI) 30.0-30.9, adult: Secondary | ICD-10-CM

## 2024-05-05 DIAGNOSIS — Z8249 Family history of ischemic heart disease and other diseases of the circulatory system: Secondary | ICD-10-CM

## 2024-05-05 DIAGNOSIS — R413 Other amnesia: Secondary | ICD-10-CM | POA: Diagnosis not present

## 2024-05-05 DIAGNOSIS — E66811 Obesity, class 1: Secondary | ICD-10-CM

## 2024-05-05 DIAGNOSIS — E559 Vitamin D deficiency, unspecified: Secondary | ICD-10-CM

## 2024-05-05 DIAGNOSIS — I10 Essential (primary) hypertension: Secondary | ICD-10-CM

## 2024-05-05 DIAGNOSIS — D509 Iron deficiency anemia, unspecified: Secondary | ICD-10-CM

## 2024-05-05 DIAGNOSIS — Z23 Encounter for immunization: Secondary | ICD-10-CM | POA: Diagnosis not present

## 2024-05-05 DIAGNOSIS — E6609 Other obesity due to excess calories: Secondary | ICD-10-CM

## 2024-05-05 LAB — POCT URINALYSIS DIP (CLINITEK)
Bilirubin, UA: NEGATIVE
Blood, UA: NEGATIVE
Glucose, UA: NEGATIVE mg/dL
Ketones, POC UA: NEGATIVE mg/dL
Leukocytes, UA: NEGATIVE
Nitrite, UA: NEGATIVE
POC PROTEIN,UA: NEGATIVE
Spec Grav, UA: 1.02 (ref 1.010–1.025)
Urobilinogen, UA: 0.2 U/dL
pH, UA: 7 (ref 5.0–8.0)

## 2024-05-05 NOTE — Progress Notes (Signed)
 I,[name], CMA,acting as a Neurosurgeon for Catheryn LOISE Slocumb, MD.,have documented all relevant documentation on the behalf of Catheryn LOISE Slocumb, MD,as directed by  Catheryn LOISE Slocumb, MD while in the presence of Catheryn LOISE Slocumb, MD.  Subjective:    Patient ID: [name] [name] , female    DOB: 1961/07/04 , 63 y.o.   MRN: [id number]  Chief Complaint  Patient presents with   Annual Exam    Patient presents today for annual exam. She is not followed by GYN. She is s/p hysterectomy.  She reports compliance with meds. She denies headaches, chest pain and shortness of breath.  Colonoscopy completed on 04/28/24.  She wants to know is their anything else she can take other than metamucil to help her have more bowel movements.  She admits not drinking enough water as she should but she is slowly improving with drinking more water.    Hypertension    HPI Discussed the use of AI scribe software for clinical note transcription with the patient, who gave verbal consent to proceed.  History of Present Illness [name] [name] is a 63 year old female who presents for a physical and blood pressure check.  She is on a regimen of amlodipine  10 mg daily, carvedilol  12.5 mg twice daily, and valsartan  160 mg daily for hypertension management. She also takes a multivitamin regularly.  She experiences constipation, characterized by small, incomplete bowel movements. A colonoscopy was performed, and she uses Metamucil with protein shakes. Linzess was tried for four days with some relief, but she is hesitant to use it regularly. She is considering occasional use of Miralax .  She reports 'brain fog' and forgetfulness, which have slightly improved with exercise and dietary changes. She is mindful of her B12 levels due to past bariatric surgery.  She has a family history of heart disease, with her father having had several heart attacks in one day.  She has been more active, aiming to exercise three days  a week, although she has not exercised this week. She is also trying to manage stress better and feels less stressed than before.   Hypertension This is a chronic problem. The current episode started more than 1 year ago. The problem has been gradually improving since onset. The problem is controlled. Pertinent negatives include no blurred vision, chest pain, palpitations or shortness of breath. Risk factors for coronary artery disease include obesity, post-menopausal state and sedentary lifestyle. The current treatment provides moderate improvement. Compliance problems include exercise.      Past Medical History:  Diagnosis Date   Abnormal glucose    Candidiasis    Cervicalgia    Essential hypertension 12/07/2016   Flatulence    Hypertension    Localized swelling, mass and lump, head    Lumbago    Morbid obesity (HCC) 12/07/2016   Obesity 12/07/2016   Onychomycosis      Family History  Problem Relation Age of Onset   Hypertension Mother    Heart failure Father    Diabetes Father    Heart attack Father    Breast cancer Neg Hx    BRCA 1/2 Neg Hx      Current Outpatient Medications:    amLODipine  (NORVASC ) 10 MG tablet, Take 1 tablet (10 mg total) by mouth daily., Disp: 90 tablet, Rfl: 2   Biotin 10000 MCG TABS, Take by mouth., Disp: , Rfl:    Calcium Citrate-Vitamin D  (CELEBRATE CALCIUM PLUS 500 PO), Take by mouth. 3 chewables daily, Disp: ,  Rfl:    carvedilol  (COREG ) 12.5 MG tablet, Take 1 tablet (12.5 mg total) by mouth 2 (two) times daily., Disp: 180 tablet, Rfl: 2   polyethylene glycol (MIRALAX ) 17 g packet, Take 17 g by mouth daily., Disp: 14 each, Rfl: 0   UNABLE TO FIND, Med Name: Beet powder added to shakes, Disp: , Rfl:    valsartan  (DIOVAN ) 160 MG tablet, Take 1 tablet (160 mg total) by mouth daily., Disp: 90 tablet, Rfl: 2   hydrocortisone  (ANUSOL -HC) 2.5 % rectal cream, Place 1 Application rectally 2 (two) times daily. (Patient not taking: Reported on 05/05/2024),  Disp: 30 g, Rfl: 0   Multiple Vitamins-Minerals (CELEBRATE MULTI-COMPLETE 18) CHEW, Chew by mouth. 1 per day (Patient not taking: Reported on 05/05/2024), Disp: , Rfl:    psyllium (METAMUCIL) 58.6 % packet, Take 1 packet by mouth daily. (Patient not taking: Reported on 05/05/2024), Disp: , Rfl:    TURMERIC CURCUMIN PO, Take 1 capsule by mouth daily. (Patient not taking: Reported on 05/05/2024), Disp: , Rfl:    No Known Allergies    The patient states she uses post menopausal status for birth control. No LMP recorded (lmp unknown). Patient has had a hysterectomy.. Negative for Dysmenorrhea. Negative for: breast discharge, breast lump(s), breast pain and breast self exam. Associated symptoms include abnormal vaginal bleeding. Pertinent negatives include abnormal bleeding (hematology), anxiety, decreased libido, depression, difficulty falling sleep, dyspareunia, history of infertility, nocturia, sexual dysfunction, sleep disturbances, urinary incontinence, urinary urgency, vaginal discharge and vaginal itching. Diet regular.The patient states her exercise level is    . The patient's tobacco use is:  Social History   Tobacco Use  Smoking Status Never  Smokeless Tobacco Never  . She has been exposed to passive smoke. The patient's alcohol use is:  Social History   Substance and Sexual Activity  Alcohol Use None    Review of Systems  Constitutional: Negative.   HENT: Negative.    Eyes: Negative.  Negative for blurred vision.  Respiratory: Negative.  Negative for shortness of breath.   Cardiovascular: Negative.  Negative for chest pain and palpitations.  Gastrointestinal:  Positive for constipation.  Endocrine: Negative.   Genitourinary: Negative.   Musculoskeletal: Negative.   Skin: Negative.   Allergic/Immunologic: Negative.   Neurological: Negative.   Hematological: Negative.   Psychiatric/Behavioral: Negative.       Today's Vitals   05/05/24 1409  BP: 130/80  Pulse: 85  Temp:  98.3 F (36.8 C)  SpO2: 98%  Weight: 195 lb 3.2 oz (88.5 kg)  Height: 5' 7 (1.702 m)   Body mass index is 30.57 kg/m.  Wt Readings from Last 3 Encounters:  05/05/24 195 lb 3.2 oz (88.5 kg)  12/31/23 202 lb 12.8 oz (92 kg)  11/05/23 202 lb (91.6 kg)     Objective:  Physical Exam Vitals and nursing note reviewed.  Constitutional:      Appearance: Normal appearance.  HENT:     Head: Normocephalic and atraumatic.     Right Ear: Tympanic membrane, ear canal and external ear normal.     Left Ear: Tympanic membrane, ear canal and external ear normal.     Nose: Nose normal.     Mouth/Throat:     Mouth: Mucous membranes are moist.     Pharynx: Oropharynx is clear.  Eyes:     Extraocular Movements: Extraocular movements intact.     Conjunctiva/sclera: Conjunctivae normal.     Pupils: Pupils are equal, round, and reactive to light.  Cardiovascular:  Rate and Rhythm: Normal rate and regular rhythm.     Pulses: Normal pulses.     Heart sounds: Normal heart sounds.  Pulmonary:     Effort: Pulmonary effort is normal.     Breath sounds: Normal breath sounds.  Chest:  Breasts:    Tanner Score is 5.     Right: Normal.     Left: Normal.  Abdominal:     General: Abdomen is flat. Bowel sounds are normal.     Palpations: Abdomen is soft.  Genitourinary:    Comments: deferred Musculoskeletal:        General: Normal range of motion.     Cervical back: Normal range of motion and neck supple.  Skin:    General: Skin is warm and dry.  Neurological:     General: No focal deficit present.     Mental Status: She is alert and oriented to person, place, and time.  Psychiatric:        Mood and Affect: Mood normal.        Behavior: Behavior normal.         Assessment And Plan:     Annual physical exam Assessment & Plan: A full exam was performed.  Importance of monthly self breast exams was discussed with the patient.  She is advised to get 30-45 minutes of regular exercise, no  less than four to five days per week. Both weight-bearing and aerobic exercises are recommended.  She is advised to follow a healthy diet with at least six fruits/veggies per day, decrease intake of red meat and other saturated fats and to increase fish intake to twice weekly.  Meats/fish should not be fried -- baked, boiled or broiled is preferable. It is also important to cut back on your sugar intake.  Be sure to read labels - try to avoid anything with added sugar, high fructose corn syrup or other sweeteners.  If you must use a sweetener, you can try stevia or monkfruit.  It is also important to avoid artificially sweetened foods/beverages and diet drinks. Lastly, wear SPF 50 sunscreen on exposed skin and when in direct sunlight for an extended period of time.  Be sure to avoid fast food restaurants and aim for at least 60 ounces of water daily.      Orders: -     CBC -     CMP14+EGFR -     Lipoprotein A (LPA) -     CT CARDIAC SCORING (SELF PAY ONLY); Future  Essential hypertension Assessment & Plan: Chronic, fair control.  Goal BP <130/80.  EKG performed, SB w/ nonspecific T abnormality. Hypertension managed with valsartan , amlodipine  and carvedilol .  - Prescribe 90-day supply of valsartan  160 mg daily. - Continue amlodipine , carvedilol , valsartan , and multivitamin. - Encourage increased water intake and dietary fiber. - Encourage regular exercise, at least three days a week. - Follow up in six months  Orders: -     POCT URINALYSIS DIP (CLINITEK) -     Microalbumin / creatinine urine ratio -     EKG 12-Lead  Chronic constipation Assessment & Plan: Chronic constipation with incomplete evacuation and small, soft stools. Linzess considered for management. - Try Miralax  as needed. - Consider low-dose Linzess every other day if Miralax  insufficient. - Encourage increased water intake and dietary fiber.   MGUS (monoclonal gammopathy of unknown significance) Assessment &  Plan: Chronic, stable.  Most recent Oncology note reviewed in detail. She is now being seen every 12 months, last visit  October 2024.    Memory change Assessment & Plan: Most recent Neuro note reviewed.  Mild cognitive impairment with emphasis on lifestyle choices for cognitive health. - Monitor B12 levels due to previous bariatric surgery. - Encourage exercise and reduced sugar intake.   Vitamin D  deficiency disease -     VITAMIN D  25 Hydroxy (Vit-D Deficiency, Fractures)  Class 1 obesity due to excess calories with serious comorbidity and body mass index (BMI) of 30.0 to 30.9 in adult Assessment & Plan: She was congratulated on her 7lb weight loss since May 2025.  She is encouraged to aim for at least 150 minutes of exercise per week.    Immunization due -     Flu vaccine trivalent PF, 6mos and older(Flulaval,Afluria,Fluarix,Fluzone)  H/O gastric sleeve -     Vitamin B12  Family history of heart disease -     Lipoprotein A (LPA) -     CT CARDIAC SCORING (SELF PAY ONLY); Future   Return for 1 year physical, 6 month bp. Patient was given opportunity to ask questions. Patient verbalized understanding of the plan and was able to repeat key elements of the plan. All questions were answered to their satisfaction.   I, Catheryn LOISE Slocumb, MD, have reviewed all documentation for this visit. The documentation on 05/05/24 for the exam, diagnosis, procedures, and orders are all accurate and complete.

## 2024-05-05 NOTE — Patient Instructions (Signed)

## 2024-05-06 LAB — MICROALBUMIN / CREATININE URINE RATIO
Creatinine, Urine: 93.6 mg/dL
Microalb/Creat Ratio: 8 mg/g{creat} (ref 0–29)
Microalbumin, Urine: 7.5 ug/mL

## 2024-05-06 LAB — CMP14+EGFR
ALT: 7 IU/L (ref 0–32)
AST: 21 IU/L (ref 0–40)
Albumin: 4.4 g/dL (ref 3.9–4.9)
Alkaline Phosphatase: 56 IU/L (ref 49–135)
BUN/Creatinine Ratio: 20 (ref 12–28)
BUN: 19 mg/dL (ref 8–27)
Bilirubin Total: 0.4 mg/dL (ref 0.0–1.2)
CO2: 25 mmol/L (ref 20–29)
Calcium: 9.5 mg/dL (ref 8.7–10.3)
Chloride: 102 mmol/L (ref 96–106)
Creatinine, Ser: 0.93 mg/dL (ref 0.57–1.00)
Globulin, Total: 3 g/dL (ref 1.5–4.5)
Glucose: 77 mg/dL (ref 70–99)
Potassium: 4.1 mmol/L (ref 3.5–5.2)
Sodium: 142 mmol/L (ref 134–144)
Total Protein: 7.4 g/dL (ref 6.0–8.5)
eGFR: 70 mL/min/1.73 (ref >59)

## 2024-05-06 LAB — CBC
Hematocrit: 36.7 % (ref 34.0–46.6)
Hemoglobin: 11.6 g/dL (ref 11.1–15.9)
MCH: 28.3 pg (ref 26.6–33.0)
MCHC: 31.6 g/dL (ref 31.5–35.7)
MCV: 90 fL (ref 79–97)
Platelets: 188 x10E3/uL (ref 150–450)
RBC: 4.1 x10E6/uL (ref 3.77–5.28)
RDW: 14.3 % (ref 11.7–15.4)
WBC: 5.2 x10E3/uL (ref 3.4–10.8)

## 2024-05-06 LAB — VITAMIN B12: Vitamin B-12: 912 pg/mL (ref 232–1245)

## 2024-05-06 LAB — LIPOPROTEIN A (LPA): Lipoprotein (a): 63.4 nmol/L (ref <75.0)

## 2024-05-06 LAB — VITAMIN D 25 HYDROXY (VIT D DEFICIENCY, FRACTURES): Vit D, 25-Hydroxy: 73.3 ng/mL (ref 30.0–100.0)

## 2024-05-16 NOTE — Assessment & Plan Note (Signed)
 Chronic, fair control.  Goal BP <130/80.  EKG performed, SB w/ nonspecific T abnormality. Hypertension managed with valsartan , amlodipine  and carvedilol .  - Prescribe 90-day supply of valsartan  160 mg daily. - Continue amlodipine , carvedilol , valsartan , and multivitamin. - Encourage increased water intake and dietary fiber. - Encourage regular exercise, at least three days a week. - Follow up in six months

## 2024-05-16 NOTE — Assessment & Plan Note (Signed)

## 2024-05-16 NOTE — Assessment & Plan Note (Signed)
 Chronic, stable.  Most recent Oncology note reviewed in detail. She is now being seen every 12 months, last visit October 2024.

## 2024-05-16 NOTE — Assessment & Plan Note (Signed)
 Chronic constipation with incomplete evacuation and small, soft stools. Linzess considered for management. - Try Miralax  as needed. - Consider low-dose Linzess every other day if Miralax  insufficient. - Encourage increased water intake and dietary fiber.

## 2024-05-16 NOTE — Assessment & Plan Note (Signed)
 She was congratulated on her 7lb weight loss since May 2025.  She is encouraged to aim for at least 150 minutes of exercise per week.

## 2024-05-16 NOTE — Assessment & Plan Note (Signed)
 Most recent Neuro note reviewed.  Mild cognitive impairment with emphasis on lifestyle choices for cognitive health. - Monitor B12 levels due to previous bariatric surgery. - Encourage exercise and reduced sugar intake.

## 2024-05-26 ENCOUNTER — Inpatient Hospital Stay: Payer: BC Managed Care – PPO

## 2024-06-08 ENCOUNTER — Other Ambulatory Visit: Payer: Self-pay | Admitting: Physician Assistant

## 2024-06-08 DIAGNOSIS — D472 Monoclonal gammopathy: Secondary | ICD-10-CM

## 2024-06-09 ENCOUNTER — Inpatient Hospital Stay: Payer: BC Managed Care – PPO | Admitting: Physician Assistant

## 2024-06-21 ENCOUNTER — Encounter: Payer: Self-pay | Admitting: Radiology

## 2024-06-30 ENCOUNTER — Inpatient Hospital Stay: Attending: Physician Assistant

## 2024-06-30 DIAGNOSIS — D472 Monoclonal gammopathy: Secondary | ICD-10-CM | POA: Insufficient documentation

## 2024-06-30 LAB — CBC WITH DIFFERENTIAL (CANCER CENTER ONLY)
Abs Immature Granulocytes: 0.01 K/uL (ref 0.00–0.07)
Basophils Absolute: 0 K/uL (ref 0.0–0.1)
Basophils Relative: 1 %
Eosinophils Absolute: 0.1 K/uL (ref 0.0–0.5)
Eosinophils Relative: 1 %
HCT: 36.9 % (ref 36.0–46.0)
Hemoglobin: 12.1 g/dL (ref 12.0–15.0)
Immature Granulocytes: 0 %
Lymphocytes Relative: 38 %
Lymphs Abs: 1.5 K/uL (ref 0.7–4.0)
MCH: 28.3 pg (ref 26.0–34.0)
MCHC: 32.8 g/dL (ref 30.0–36.0)
MCV: 86.2 fL (ref 80.0–100.0)
Monocytes Absolute: 0.3 K/uL (ref 0.1–1.0)
Monocytes Relative: 9 %
Neutro Abs: 2.1 K/uL (ref 1.7–7.7)
Neutrophils Relative %: 51 %
Platelet Count: 186 K/uL (ref 150–400)
RBC: 4.28 MIL/uL (ref 3.87–5.11)
RDW: 16.2 % — ABNORMAL HIGH (ref 11.5–15.5)
WBC Count: 4 K/uL (ref 4.0–10.5)
nRBC: 0 % (ref 0.0–0.2)

## 2024-06-30 LAB — CMP (CANCER CENTER ONLY)
ALT: 5 U/L (ref 0–44)
AST: 16 U/L (ref 15–41)
Albumin: 4.4 g/dL (ref 3.5–5.0)
Alkaline Phosphatase: 50 U/L (ref 38–126)
Anion gap: 4 — ABNORMAL LOW (ref 5–15)
BUN: 18 mg/dL (ref 8–23)
CO2: 31 mmol/L (ref 22–32)
Calcium: 9.3 mg/dL (ref 8.9–10.3)
Chloride: 105 mmol/L (ref 98–111)
Creatinine: 0.82 mg/dL (ref 0.44–1.00)
GFR, Estimated: >60 mL/min (ref >60)
Glucose, Bld: 61 mg/dL — ABNORMAL LOW (ref 70–99)
Potassium: 3.5 mmol/L (ref 3.5–5.1)
Sodium: 140 mmol/L (ref 135–145)
Total Bilirubin: 0.5 mg/dL (ref 0.0–1.2)
Total Protein: 7.9 g/dL (ref 6.5–8.1)

## 2024-07-01 LAB — KAPPA/LAMBDA LIGHT CHAINS
Kappa free light chain: 16.6 mg/L (ref 3.3–19.4)
Kappa, lambda light chain ratio: 1.16 (ref 0.26–1.65)
Lambda free light chains: 14.3 mg/L (ref 5.7–26.3)

## 2024-07-05 DIAGNOSIS — D472 Monoclonal gammopathy: Secondary | ICD-10-CM | POA: Diagnosis not present

## 2024-07-05 LAB — MULTIPLE MYELOMA PANEL, SERUM
Albumin SerPl Elph-Mcnc: 3.9 g/dL (ref 2.9–4.4)
Albumin/Glob SerPl: 1.1 (ref 0.7–1.7)
Alpha 1: 0.2 g/dL (ref 0.0–0.4)
Alpha2 Glob SerPl Elph-Mcnc: 0.7 g/dL (ref 0.4–1.0)
B-Globulin SerPl Elph-Mcnc: 0.9 g/dL (ref 0.7–1.3)
Gamma Glob SerPl Elph-Mcnc: 1.8 g/dL (ref 0.4–1.8)
Globulin, Total: 3.6 g/dL (ref 2.2–3.9)
IgA: 109 mg/dL (ref 87–352)
IgG (Immunoglobin G), Serum: 1751 mg/dL — ABNORMAL HIGH (ref 586–1602)
IgM (Immunoglobulin M), Srm: 98 mg/dL (ref 26–217)
M Protein SerPl Elph-Mcnc: 0.9 g/dL — ABNORMAL HIGH
Total Protein ELP: 7.5 g/dL (ref 6.0–8.5)

## 2024-07-06 ENCOUNTER — Other Ambulatory Visit: Payer: Self-pay

## 2024-07-06 DIAGNOSIS — D472 Monoclonal gammopathy: Secondary | ICD-10-CM

## 2024-07-07 ENCOUNTER — Inpatient Hospital Stay (HOSPITAL_BASED_OUTPATIENT_CLINIC_OR_DEPARTMENT_OTHER): Admitting: Physician Assistant

## 2024-07-07 VITALS — BP 129/75 | HR 60 | Temp 97.9°F | Resp 17 | Ht 67.0 in | Wt 194.0 lb

## 2024-07-07 DIAGNOSIS — D472 Monoclonal gammopathy: Secondary | ICD-10-CM

## 2024-07-07 NOTE — Progress Notes (Signed)
 [facility] [phone]   [phone]  PROGRESS NOTE  Patient Care Team: Jarold Medici, MD as PCP - General (Internal Medicine)  CHIEF COMPLAINTS/PURPOSE OF CONSULTATION:  IgG kappa MGUS  HISTORY OF PRESENTING ILLNESS:  [name] [name] 63 y.o. female returns for a follow up for IgG kappa MGUS. She was last seen by me 06/03/2023. In the interim, she denies any changes to her health.  On exam today, [name] [name] reports her energy and appetite are stable. She has intermittent episodes of fatigue but continues to complete her ADLs on her own. She denies nausea, vomiting or bowel habit changes. She denies easy bruising or overt signs of bleeding. She denies any signs of bone pain including back pain, hip pain or shoulder pain. She denies fevers, chills, sweats, shortness of breath, chest pain or cough. She has no other complaints. Rest of the ROS is below.   MEDICAL HISTORY:  Past Medical History:  Diagnosis Date   Abnormal glucose    Candidiasis    Cervicalgia    Essential hypertension 12/07/2016   Flatulence    Hypertension    Localized swelling, mass and lump, head    Lumbago    Morbid obesity (HCC) 12/07/2016   Obesity 12/07/2016   Onychomycosis     SURGICAL HISTORY: Past Surgical History:  Procedure Laterality Date   ABDOMINAL HYSTERECTOMY     LAPAROSCOPIC GASTRIC SLEEVE RESECTION  08/08/2020    SOCIAL HISTORY: Social History   Socioeconomic History   Marital status: Married    Spouse name: Not on file   Number of children: Not on file   Years of education: Not on file   Highest education level: Associate degree: academic program  Occupational History   Not on file  Tobacco Use   Smoking status: Never   Smokeless tobacco: Never  Substance and Sexual Activity   Alcohol use: Not on file   Drug use: Not on file   Sexual activity: Not on file  Other Topics Concern   Not on file  Social History Narrative   Not on file    Social Drivers of Health   Financial Resource Strain: Low Risk  (05/05/2024)   Overall Financial Resource Strain (CARDIA)    Difficulty of Paying Living Expenses: Not very hard  Food Insecurity: Unknown (05/05/2024)   Hunger Vital Sign    Worried About Running Out of Food in the Last Year: Never true    Ran Out of Food in the Last Year: Patient declined  Transportation Needs: No Transportation Needs (05/05/2024)   PRAPARE - Administrator, Civil Service (Medical): No    Lack of Transportation (Non-Medical): No  Physical Activity: Insufficiently Active (05/05/2024)   Exercise Vital Sign    Days of Exercise per Week: 2 days    Minutes of Exercise per Session: 50 min  Stress: Stress Concern Present (05/05/2024)   [employer] of Occupational Health - Occupational Stress Questionnaire    Feeling of Stress: To some extent  Social Connections: Socially Integrated (05/05/2024)   Social Connection and Isolation Panel    Frequency of Communication with Friends and Family: More than three times a week    Frequency of Social Gatherings with Friends and Family: Twice a week    Attends Religious Services: More than 4 times per year    Active Member of [employer] or Organizations: Yes    Attends Banker Meetings: More than 4 times per year    Marital Status:  Married  Catering Manager Violence: Not on file    FAMILY HISTORY: Family History  Problem Relation Age of Onset   Hypertension Mother    Heart failure Father    Diabetes Father    Heart attack Father    Breast cancer Neg Hx    BRCA 1/2 Neg Hx     ALLERGIES:  has no known allergies.  MEDICATIONS:  Current Outpatient Medications  Medication Sig Dispense Refill   amLODipine  (NORVASC ) 10 MG tablet Take 1 tablet (10 mg total) by mouth daily. 90 tablet 2   Biotin 10000 MCG TABS Take by mouth.     Calcium Citrate-Vitamin D  (CELEBRATE CALCIUM PLUS 500 PO) Take by mouth. 3 chewables daily     carvedilol  (COREG )  12.5 MG tablet Take 1 tablet (12.5 mg total) by mouth 2 (two) times daily. 180 tablet 2   Multiple Vitamins-Minerals (CELEBRATE MULTI-COMPLETE 18) CHEW Chew by mouth. 1 per day     polyethylene glycol (MIRALAX ) 17 g packet Take 17 g by mouth daily. 14 each 0   UNABLE TO FIND Med Name: Beet powder added to shakes     valsartan  (DIOVAN ) 160 MG tablet Take 1 tablet (160 mg total) by mouth daily. 90 tablet 2   hydrocortisone  (ANUSOL -HC) 2.5 % rectal cream Place 1 Application rectally 2 (two) times daily. (Patient not taking: Reported on 05/05/2024) 30 g 0   psyllium (METAMUCIL) 58.6 % packet Take 1 packet by mouth daily. (Patient not taking: Reported on 07/07/2024)     TURMERIC CURCUMIN PO Take 1 capsule by mouth daily. (Patient not taking: Reported on 07/07/2024)     No current facility-administered medications for this visit.    REVIEW OF SYSTEMS:   Constitutional: ( - ) fevers, ( - )  chills , ( - ) night sweats Eyes: ( - ) blurriness of vision, ( - ) double vision, ( - ) watery eyes Ears, nose, mouth, throat, and face: ( - ) mucositis, ( - ) sore throat Respiratory: ( - ) cough, ( - ) dyspnea, ( - ) wheezes Cardiovascular: ( - ) palpitation, ( - ) chest discomfort, ( - ) lower extremity swelling Gastrointestinal:  ( - ) nausea, ( - ) heartburn, ( - ) change in bowel habits Skin: ( - ) abnormal skin rashes Lymphatics: ( - ) new lymphadenopathy, ( - ) easy bruising Neurological: ( - ) numbness, ( - ) tingling, ( - ) new weaknesses Behavioral/Psych: ( - ) mood change, ( - ) new changes  All other systems were reviewed with the patient and are negative.  PHYSICAL EXAMINATION: ECOG PERFORMANCE STATUS: 0 - Asymptomatic  Vitals:   07/07/24 1034  BP: 129/75  Pulse: 60  Resp: 17  Temp: 97.9 F (36.6 C)  SpO2: 100%   Filed Weights   07/07/24 1034  Weight: 194 lb (88 kg)    GENERAL: well appearing female in NAD  SKIN: skin color, texture, turgor are normal, no rashes or significant  lesions EYES: conjunctiva are pink and non-injected, sclera clear LUNGS: clear to auscultation and percussion with normal breathing effort HEART: regular rate & rhythm and no murmurs and no lower extremity edema Musculoskeletal: no cyanosis of digits and no clubbing  PSYCH: alert & oriented x 3, fluent speech NEURO: no focal motor/sensory deficits  LABORATORY DATA:  I have reviewed the data as listed    Latest Ref Rng & Units 06/30/2024   10:57 AM 05/05/2024    3:20 PM 05/23/2023  11:11 AM  CBC  WBC 4.0 - 10.5 K/uL 4.0  5.2  3.9   Hemoglobin 12.0 - 15.0 g/dL 87.8  88.3  87.9   Hematocrit 36.0 - 46.0 % 36.9  36.7  37.7   Platelets 150 - 400 K/uL 186  188  194        Latest Ref Rng & Units 06/30/2024   10:57 AM 05/05/2024    3:20 PM 12/31/2023    9:51 AM  CMP  Glucose 70 - 99 mg/dL 61  77  80   BUN 8 - 23 mg/dL 18  19  15    Creatinine 0.44 - 1.00 mg/dL 9.17  9.06  9.17   Sodium 135 - 145 mmol/L 140  142  144   Potassium 3.5 - 5.1 mmol/L 3.5  4.1  3.9   Chloride 98 - 111 mmol/L 105  102  102   CO2 22 - 32 mmol/L 31  25  25    Calcium 8.9 - 10.3 mg/dL 9.3  9.5  9.0   Total Protein 6.5 - 8.1 g/dL 7.9  7.4  7.0   Total Bilirubin 0.0 - 1.2 mg/dL 0.5  0.4  0.4   Alkaline Phos 38 - 126 U/L 50  56  57   AST 15 - 41 U/L 16  21  16    ALT 0 - 44 U/L 5  7  6     ASSESSMENT & PLAN [name] [name] is a 63 y.o. female who presents for a follow up for IgG Kappa MGUS.   #IgG kappa MGUS: --Labs from 06/30/2024 reviewed with patient. CBC showed no cytopenias with WBC 4.0, Hgb 12.1, Plt 186K.  CMP showed normal creatinine and calcium levels. SPEP showed stable M protein measuring 0.9 g/dL. Serum free light chains were normal.  --24 hour UPEP was submitted yesterday, awaiting results.  --Last bone met survey was 05/28/2023 with no lytic lesions. Repeat bone met survey will be schedule in the next 1-2 weeks. --RTC in 12 months with MGUS clinic unless above pending studies require further  intervention.    Orders Placed This Encounter  Procedures   DG Bone Survey Met    Standing Status:   Future    Expected Date:   07/14/2024    Expiration Date:   07/07/2025    Reason for Exam (SYMPTOM  OR DIAGNOSIS REQUIRED):   evaluate for lytic lesions    Preferred imaging location?:   [facility]   CBC with Differential (Cancer Center Only)    Standing Status:   Future    Expected Date:   07/07/2025    Expiration Date:   10/05/2025   CMP (Cancer Center only)    Standing Status:   Future    Expected Date:   07/07/2025    Expiration Date:   10/05/2025   Multiple Myeloma Panel (SPEP&IFE w/QIG)    Standing Status:   Future    Expected Date:   07/07/2025    Expiration Date:   10/05/2025   Kappa/lambda light chains    Standing Status:   Future    Expected Date:   07/07/2025    Expiration Date:   10/05/2025   24-Hr Ur UPEP/UIFE/Light Chains/TP    Standing Status:   Future    Expected Date:   07/07/2025    Expiration Date:   10/05/2025    All questions were answered. The patient knows to call the clinic with any problems, questions or concerns.  I have spent a total of  25 minutes minutes of face-to-face and non-face-to-face time, preparing to see the patient,  performing a medically appropriate examination, counseling and educating the patient, ordering tests/procedures, documenting clinical information in the electronic health record, independently interpreting results and communicating results to the patient, and care coordination.   Johnston Police, PA-C Department of Hematology/Oncology [facility] Phone: [phone]

## 2024-07-09 LAB — UPEP/UIFE/LIGHT CHAINS/TP, 24-HR UR
% BETA, Urine: 25.9 %
ALPHA 1 URINE: 4 %
Albumin, U: 30.1 %
Alpha 2, Urine: 15.7 %
Free Kappa Lt Chains,Ur: 31.22 mg/L (ref 1.17–86.46)
Free Kappa/Lambda Ratio: 4.96 (ref 1.83–14.26)
Free Lambda Lt Chains,Ur: 6.3 mg/L (ref 0.27–15.21)
GAMMA GLOBULIN URINE: 24.4 %
M-SPIKE %, Urine: 12.1 % — ABNORMAL HIGH
M-Spike, Mg/24 Hr: 17 mg/(24.h) — ABNORMAL HIGH
Total Protein, Urine-Ur/day: 138 mg/(24.h) (ref 30–150)
Total Protein, Urine: 10.6 mg/dL
Total Volume: 1300

## 2024-07-12 ENCOUNTER — Ambulatory Visit: Payer: Self-pay | Admitting: Physician Assistant

## 2024-07-16 ENCOUNTER — Ambulatory Visit (HOSPITAL_COMMUNITY)
Admission: RE | Admit: 2024-07-16 | Discharge: 2024-07-16 | Disposition: A | Discharge status: Home / Self Care | Source: Ambulatory Visit | Attending: Physician Assistant | Admitting: Physician Assistant

## 2024-07-16 DIAGNOSIS — D472 Monoclonal gammopathy: Secondary | ICD-10-CM | POA: Insufficient documentation

## 2024-07-16 DIAGNOSIS — M170 Bilateral primary osteoarthritis of knee: Secondary | ICD-10-CM | POA: Diagnosis not present

## 2024-07-16 DIAGNOSIS — M47816 Spondylosis without myelopathy or radiculopathy, lumbar region: Secondary | ICD-10-CM | POA: Diagnosis not present

## 2024-07-16 DIAGNOSIS — M47814 Spondylosis without myelopathy or radiculopathy, thoracic region: Secondary | ICD-10-CM | POA: Diagnosis not present

## 2024-07-26 ENCOUNTER — Ambulatory Visit (HOSPITAL_COMMUNITY): Payer: Self-pay | Admitting: Physician Assistant

## 2024-11-03 ENCOUNTER — Ambulatory Visit: Payer: Self-pay | Admitting: Internal Medicine

## 2025-05-12 ENCOUNTER — Encounter: Admitting: Internal Medicine

## 2025-05-18 ENCOUNTER — Encounter: Payer: Self-pay | Admitting: Internal Medicine

## 2025-07-06 ENCOUNTER — Inpatient Hospital Stay

## 2025-07-13 ENCOUNTER — Inpatient Hospital Stay: Admitting: Physician Assistant
